# Patient Record
Sex: Male | Born: 1945 | Race: White | Hispanic: No | Marital: Married | Smoking: Never smoker
Health system: Southern US, Community
[De-identification: ages and names within clinical notes are randomized; demographics above are authoritative.]

## PROBLEM LIST (undated history)

## (undated) DIAGNOSIS — K37 Unspecified appendicitis: Secondary | ICD-10-CM

## (undated) DIAGNOSIS — I10 Essential (primary) hypertension: Secondary | ICD-10-CM

## (undated) DIAGNOSIS — E785 Hyperlipidemia, unspecified: Secondary | ICD-10-CM

## (undated) DIAGNOSIS — M1990 Unspecified osteoarthritis, unspecified site: Secondary | ICD-10-CM

## (undated) HISTORY — DX: Hyperlipidemia, unspecified: E78.5

## (undated) HISTORY — PX: APPENDECTOMY: SHX54

## (undated) HISTORY — DX: Unspecified appendicitis: K37

## (undated) HISTORY — DX: Unspecified osteoarthritis, unspecified site: M19.90

## (undated) HISTORY — PX: EYE MUSCLE SURGERY: SHX370

## (undated) HISTORY — DX: Essential (primary) hypertension: I10

## (undated) HISTORY — PX: COLONOSCOPY: SHX174

---

## 1999-10-31 ENCOUNTER — Encounter: Payer: Self-pay | Admitting: Internal Medicine

## 1999-10-31 ENCOUNTER — Ambulatory Visit (HOSPITAL_COMMUNITY): Admission: RE | Admit: 1999-10-31 | Discharge: 1999-10-31 | Payer: Self-pay | Admitting: Internal Medicine

## 2000-12-01 ENCOUNTER — Encounter: Payer: Self-pay | Admitting: Internal Medicine

## 2001-12-04 ENCOUNTER — Encounter: Payer: Self-pay | Admitting: *Deleted

## 2001-12-04 ENCOUNTER — Ambulatory Visit (HOSPITAL_COMMUNITY): Admission: RE | Admit: 2001-12-04 | Discharge: 2001-12-04 | Payer: Self-pay | Admitting: *Deleted

## 2002-05-27 ENCOUNTER — Ambulatory Visit (HOSPITAL_BASED_OUTPATIENT_CLINIC_OR_DEPARTMENT_OTHER): Admission: RE | Admit: 2002-05-27 | Discharge: 2002-05-27 | Payer: Self-pay | Admitting: Ophthalmology

## 2002-07-14 LAB — HM COLONOSCOPY

## 2002-11-29 ENCOUNTER — Encounter: Payer: Self-pay | Admitting: Internal Medicine

## 2004-05-08 ENCOUNTER — Ambulatory Visit: Payer: Self-pay | Admitting: Internal Medicine

## 2004-05-15 ENCOUNTER — Ambulatory Visit: Payer: Self-pay | Admitting: Internal Medicine

## 2004-11-12 ENCOUNTER — Ambulatory Visit: Payer: Self-pay | Admitting: Internal Medicine

## 2004-11-19 ENCOUNTER — Ambulatory Visit: Payer: Self-pay | Admitting: Internal Medicine

## 2005-06-04 ENCOUNTER — Ambulatory Visit: Payer: Self-pay | Admitting: Internal Medicine

## 2005-06-10 ENCOUNTER — Ambulatory Visit: Payer: Self-pay | Admitting: Internal Medicine

## 2005-12-10 ENCOUNTER — Ambulatory Visit: Payer: Self-pay | Admitting: Internal Medicine

## 2006-05-27 ENCOUNTER — Ambulatory Visit: Payer: Self-pay | Admitting: Internal Medicine

## 2006-05-27 LAB — CONVERTED CEMR LAB
ALT: 30 units/L (ref 0–40)
AST: 32 units/L (ref 0–37)
Albumin: 4.1 g/dL (ref 3.5–5.2)
Alkaline Phosphatase: 60 units/L (ref 39–117)
BUN: 12 mg/dL (ref 6–23)
Basophils Absolute: 0 10*3/uL (ref 0.0–0.1)
Basophils Relative: 0.3 % (ref 0.0–1.0)
CO2: 31 meq/L (ref 19–32)
Calcium: 9.5 mg/dL (ref 8.4–10.5)
Chloride: 103 meq/L (ref 96–112)
Chol/HDL Ratio, serum: 6.8
Cholesterol: 201 mg/dL (ref 0–200)
Creatinine, Ser: 1.2 mg/dL (ref 0.4–1.5)
Eosinophil percent: 2.1 % (ref 0.0–5.0)
GFR calc non Af Amer: 66 mL/min
Glomerular Filtration Rate, Af Am: 80 mL/min/{1.73_m2}
Glucose, Bld: 101 mg/dL — ABNORMAL HIGH (ref 70–99)
HCT: 44.8 % (ref 39.0–52.0)
HDL: 29.7 mg/dL — ABNORMAL LOW (ref >39.0)
Hemoglobin: 15.1 g/dL (ref 13.0–17.0)
LDL DIRECT: 116.9 mg/dL
Lymphocytes Relative: 25.1 % (ref 12.0–46.0)
MCHC: 33.8 g/dL (ref 30.0–36.0)
MCV: 91.3 fL (ref 78.0–100.0)
Monocytes Absolute: 0.6 10*3/uL (ref 0.2–0.7)
Monocytes Relative: 7.4 % (ref 3.0–11.0)
Neutro Abs: 5.7 10*3/uL (ref 1.4–7.7)
Neutrophils Relative %: 65.1 % (ref 43.0–77.0)
PSA: 0.49 ng/mL (ref 0.10–4.00)
Platelets: 243 10*3/uL (ref 150–400)
Potassium: 3.6 meq/L (ref 3.5–5.1)
RBC: 4.9 M/uL (ref 4.22–5.81)
RDW: 12.2 % (ref 11.5–14.6)
Sodium: 141 meq/L (ref 135–145)
TSH: 1.93 microintl units/mL (ref 0.35–5.50)
Total Bilirubin: 1.1 mg/dL (ref 0.3–1.2)
Total Protein: 7.1 g/dL (ref 6.0–8.3)
Triglyceride fasting, serum: 264 mg/dL (ref 0–149)
VLDL: 53 mg/dL — ABNORMAL HIGH (ref 0–40)
WBC: 8.7 10*3/uL (ref 4.5–10.5)

## 2006-06-03 ENCOUNTER — Ambulatory Visit: Payer: Self-pay | Admitting: Internal Medicine

## 2006-12-02 ENCOUNTER — Ambulatory Visit: Payer: Self-pay | Admitting: Internal Medicine

## 2006-12-02 LAB — CONVERTED CEMR LAB
ALT: 27 units/L (ref 0–40)
AST: 26 units/L (ref 0–37)
Albumin: 4.2 g/dL (ref 3.5–5.2)
Alkaline Phosphatase: 69 units/L (ref 39–117)
BUN: 16 mg/dL (ref 6–23)
Bilirubin, Direct: 0.1 mg/dL (ref 0.0–0.3)
CO2: 33 meq/L — ABNORMAL HIGH (ref 19–32)
Calcium: 9.5 mg/dL (ref 8.4–10.5)
Chloride: 105 meq/L (ref 96–112)
Cholesterol: 191 mg/dL (ref 0–200)
Creatinine, Ser: 1.2 mg/dL (ref 0.4–1.5)
Direct LDL: 101.3 mg/dL
GFR calc Af Amer: 80 mL/min
GFR calc non Af Amer: 66 mL/min
Glucose, Bld: 106 mg/dL — ABNORMAL HIGH (ref 70–99)
HDL: 29.6 mg/dL — ABNORMAL LOW (ref >39.0)
Potassium: 3.7 meq/L (ref 3.5–5.1)
Sodium: 142 meq/L (ref 135–145)
Total Bilirubin: 0.6 mg/dL (ref 0.3–1.2)
Total CHOL/HDL Ratio: 6.5
Total Protein: 6.9 g/dL (ref 6.0–8.3)
Triglycerides: 304 mg/dL (ref 0–149)
VLDL: 61 mg/dL — ABNORMAL HIGH (ref 0–40)

## 2007-03-25 DIAGNOSIS — I10 Essential (primary) hypertension: Secondary | ICD-10-CM | POA: Insufficient documentation

## 2007-03-25 DIAGNOSIS — E785 Hyperlipidemia, unspecified: Secondary | ICD-10-CM | POA: Insufficient documentation

## 2007-06-07 ENCOUNTER — Ambulatory Visit: Payer: Self-pay | Admitting: Internal Medicine

## 2007-06-07 LAB — CONVERTED CEMR LAB
ALT: 29 units/L (ref 0–53)
AST: 29 units/L (ref 0–37)
Albumin: 4.1 g/dL (ref 3.5–5.2)
Alkaline Phosphatase: 59 units/L (ref 39–117)
BUN: 16 mg/dL (ref 6–23)
Basophils Absolute: 0 10*3/uL (ref 0.0–0.1)
Basophils Relative: 0.1 % (ref 0.0–1.0)
Bilirubin, Direct: 0.1 mg/dL (ref 0.0–0.3)
CO2: 29 meq/L (ref 19–32)
Calcium: 9.5 mg/dL (ref 8.4–10.5)
Chloride: 100 meq/L (ref 96–112)
Cholesterol: 207 mg/dL (ref 0–200)
Creatinine, Ser: 1.2 mg/dL (ref 0.4–1.5)
Direct LDL: 132 mg/dL
Eosinophils Absolute: 0.2 10*3/uL (ref 0.0–0.6)
Eosinophils Relative: 1.8 % (ref 0.0–5.0)
GFR calc Af Amer: 79 mL/min
GFR calc non Af Amer: 66 mL/min
Glucose, Bld: 99 mg/dL (ref 70–99)
Glucose, Urine, Semiquant: NEGATIVE
HCT: 43 % (ref 39.0–52.0)
HDL: 30.9 mg/dL — ABNORMAL LOW (ref >39.0)
Hemoglobin: 15.1 g/dL (ref 13.0–17.0)
Ketones, urine, test strip: NEGATIVE
Lymphocytes Relative: 22.9 % (ref 12.0–46.0)
MCHC: 35.2 g/dL (ref 30.0–36.0)
MCV: 90.9 fL (ref 78.0–100.0)
Monocytes Absolute: 0.6 10*3/uL (ref 0.2–0.7)
Monocytes Relative: 6.7 % (ref 3.0–11.0)
Neutro Abs: 6.1 10*3/uL (ref 1.4–7.7)
Neutrophils Relative %: 68.5 % (ref 43.0–77.0)
Nitrite: NEGATIVE
PSA: 0.7 ng/mL (ref 0.10–4.00)
Platelets: 230 10*3/uL (ref 150–400)
Potassium: 3.9 meq/L (ref 3.5–5.1)
RBC: 4.73 M/uL (ref 4.22–5.81)
RDW: 13.1 % (ref 11.5–14.6)
Sodium: 140 meq/L (ref 135–145)
Specific Gravity, Urine: >=1.03
TSH: 2.01 microintl units/mL (ref 0.35–5.50)
Total Bilirubin: 0.7 mg/dL (ref 0.3–1.2)
Total CHOL/HDL Ratio: 6.7
Total Protein: 6.7 g/dL (ref 6.0–8.3)
Triglycerides: 248 mg/dL (ref 0–149)
Urobilinogen, UA: 0.2
VLDL: 50 mg/dL — ABNORMAL HIGH (ref 0–40)
WBC Urine, dipstick: NEGATIVE
WBC: 9 10*3/uL (ref 4.5–10.5)
pH: 5

## 2007-06-14 ENCOUNTER — Ambulatory Visit: Payer: Self-pay | Admitting: Internal Medicine

## 2007-08-19 ENCOUNTER — Ambulatory Visit: Payer: Self-pay | Admitting: Internal Medicine

## 2007-08-19 LAB — CONVERTED CEMR LAB
ALT: 34 units/L (ref 0–53)
AST: 30 units/L (ref 0–37)
Albumin: 4.1 g/dL (ref 3.5–5.2)
Alkaline Phosphatase: 68 units/L (ref 39–117)
Bilirubin, Direct: 0.2 mg/dL (ref 0.0–0.3)
Cholesterol: 172 mg/dL (ref 0–200)
Direct LDL: 80.6 mg/dL
HDL: 26.1 mg/dL — ABNORMAL LOW (ref >39.0)
Total Bilirubin: 1.5 mg/dL — ABNORMAL HIGH (ref 0.3–1.2)
Total CHOL/HDL Ratio: 6.6
Total Protein: 6.7 g/dL (ref 6.0–8.3)
Triglycerides: 340 mg/dL (ref 0–149)
VLDL: 68 mg/dL — ABNORMAL HIGH (ref 0–40)

## 2007-08-27 ENCOUNTER — Ambulatory Visit: Payer: Self-pay | Admitting: Internal Medicine

## 2007-11-09 ENCOUNTER — Telehealth: Payer: Self-pay | Admitting: Internal Medicine

## 2007-11-17 ENCOUNTER — Telehealth: Payer: Self-pay | Admitting: Internal Medicine

## 2007-12-31 ENCOUNTER — Ambulatory Visit: Payer: Self-pay | Admitting: Internal Medicine

## 2007-12-31 DIAGNOSIS — M109 Gout, unspecified: Secondary | ICD-10-CM | POA: Insufficient documentation

## 2008-02-28 ENCOUNTER — Ambulatory Visit: Payer: Self-pay | Admitting: Internal Medicine

## 2008-02-28 LAB — CONVERTED CEMR LAB
ALT: 26 units/L (ref 0–53)
AST: 24 units/L (ref 0–37)
Albumin: 4.1 g/dL (ref 3.5–5.2)
Alkaline Phosphatase: 90 units/L (ref 39–117)
Bilirubin, Direct: 0.1 mg/dL (ref 0.0–0.3)
Cholesterol: 155 mg/dL (ref 0–200)
Direct LDL: 70.6 mg/dL
HDL: 23.1 mg/dL — ABNORMAL LOW (ref >39.0)
Total Bilirubin: 0.7 mg/dL (ref 0.3–1.2)
Total CHOL/HDL Ratio: 6.7
Total Protein: 6.6 g/dL (ref 6.0–8.3)
Triglycerides: 388 mg/dL (ref 0–149)
VLDL: 78 mg/dL — ABNORMAL HIGH (ref 0–40)

## 2008-05-19 ENCOUNTER — Ambulatory Visit: Payer: Self-pay | Admitting: Internal Medicine

## 2008-11-10 ENCOUNTER — Ambulatory Visit: Payer: Self-pay | Admitting: Internal Medicine

## 2008-11-10 LAB — CONVERTED CEMR LAB
ALT: 35 units/L (ref 0–53)
AST: 31 units/L (ref 0–37)
Albumin: 4.2 g/dL (ref 3.5–5.2)
Alkaline Phosphatase: 93 units/L (ref 39–117)
BUN: 17 mg/dL (ref 6–23)
Basophils Absolute: 0 10*3/uL (ref 0.0–0.1)
Basophils Relative: 0.1 % (ref 0.0–3.0)
Bilirubin Urine: NEGATIVE
Bilirubin, Direct: 0.2 mg/dL (ref 0.0–0.3)
Blood in Urine, dipstick: NEGATIVE
CO2: 26 meq/L (ref 19–32)
Calcium: 8.9 mg/dL (ref 8.4–10.5)
Chloride: 107 meq/L (ref 96–112)
Cholesterol: 143 mg/dL (ref 0–200)
Creatinine, Ser: 1.1 mg/dL (ref 0.4–1.5)
Eosinophils Absolute: 0.1 10*3/uL (ref 0.0–0.7)
Eosinophils Relative: 1.4 % (ref 0.0–5.0)
GFR calc non Af Amer: 72.23 mL/min (ref >60)
Glucose, Bld: 88 mg/dL (ref 70–99)
Glucose, Urine, Semiquant: NEGATIVE
HCT: 44 % (ref 39.0–52.0)
HDL: 25.3 mg/dL — ABNORMAL LOW (ref >39.00)
Hemoglobin: 15.4 g/dL (ref 13.0–17.0)
Ketones, urine, test strip: NEGATIVE
LDL Cholesterol: 80 mg/dL (ref 0–99)
Lymphocytes Relative: 15.1 % (ref 12.0–46.0)
Lymphs Abs: 1.3 10*3/uL (ref 0.7–4.0)
MCHC: 35.1 g/dL (ref 30.0–36.0)
MCV: 89.1 fL (ref 78.0–100.0)
Monocytes Absolute: 0.5 10*3/uL (ref 0.1–1.0)
Monocytes Relative: 6.2 % (ref 3.0–12.0)
Neutro Abs: 6.7 10*3/uL (ref 1.4–7.7)
Neutrophils Relative %: 77.2 % — ABNORMAL HIGH (ref 43.0–77.0)
Nitrite: NEGATIVE
PSA: 0.44 ng/mL (ref 0.10–4.00)
Platelets: 218 10*3/uL (ref 150.0–400.0)
Potassium: 4.5 meq/L (ref 3.5–5.1)
Protein, U semiquant: NEGATIVE
RBC: 4.93 M/uL (ref 4.22–5.81)
RDW: 12.6 % (ref 11.5–14.6)
Sodium: 143 meq/L (ref 135–145)
Specific Gravity, Urine: 1.025
TSH: 1.59 microintl units/mL (ref 0.35–5.50)
Total Bilirubin: 1 mg/dL (ref 0.3–1.2)
Total CHOL/HDL Ratio: 6
Total Protein: 6.6 g/dL (ref 6.0–8.3)
Triglycerides: 188 mg/dL — ABNORMAL HIGH (ref 0.0–149.0)
Urobilinogen, UA: 1
VLDL: 37.6 mg/dL (ref 0.0–40.0)
WBC Urine, dipstick: NEGATIVE
WBC: 8.6 10*3/uL (ref 4.5–10.5)
pH: 5

## 2008-11-20 ENCOUNTER — Ambulatory Visit: Payer: Self-pay | Admitting: Internal Medicine

## 2009-04-17 ENCOUNTER — Ambulatory Visit: Payer: Self-pay | Admitting: Internal Medicine

## 2009-04-17 LAB — CONVERTED CEMR LAB
Albumin: 4 g/dL (ref 3.5–5.2)
Cholesterol: 143 mg/dL (ref 0–200)
Direct LDL: 79.1 mg/dL
GFR calc non Af Amer: 59.48 mL/min (ref >60)
HDL: 29 mg/dL — ABNORMAL LOW (ref >39.00)
Potassium: 3.6 meq/L (ref 3.5–5.1)
Sodium: 140 meq/L (ref 135–145)
Total Protein: 7.1 g/dL (ref 6.0–8.3)
VLDL: 43.4 mg/dL — ABNORMAL HIGH (ref 0.0–40.0)

## 2009-04-24 ENCOUNTER — Ambulatory Visit: Payer: Self-pay | Admitting: Internal Medicine

## 2009-08-10 ENCOUNTER — Ambulatory Visit: Payer: Self-pay | Admitting: Internal Medicine

## 2009-11-16 ENCOUNTER — Ambulatory Visit: Payer: Self-pay | Admitting: Internal Medicine

## 2009-11-16 LAB — CONVERTED CEMR LAB
ALT: 32 units/L (ref 0–53)
Albumin: 4.2 g/dL (ref 3.5–5.2)
Bilirubin Urine: NEGATIVE
Calcium: 9 mg/dL (ref 8.4–10.5)
Chloride: 103 meq/L (ref 96–112)
Cholesterol: 151 mg/dL (ref 0–200)
Eosinophils Relative: 1.5 % (ref 0.0–5.0)
Glucose, Bld: 91 mg/dL (ref 70–99)
Glucose, Urine, Semiquant: NEGATIVE
HCT: 44.6 % (ref 39.0–52.0)
LDL Cholesterol: 80 mg/dL (ref 0–99)
Lymphs Abs: 1.7 10*3/uL (ref 0.7–4.0)
MCV: 89.5 fL (ref 78.0–100.0)
Monocytes Absolute: 0.6 10*3/uL (ref 0.1–1.0)
PSA: 0.49 ng/mL (ref 0.10–4.00)
Platelets: 240 10*3/uL (ref 150.0–400.0)
Potassium: 3.8 meq/L (ref 3.5–5.1)
TSH: 1.51 microintl units/mL (ref 0.35–5.50)
Total Bilirubin: 0.7 mg/dL (ref 0.3–1.2)
Total Protein: 6.8 g/dL (ref 6.0–8.3)
WBC: 8.1 10*3/uL (ref 4.5–10.5)
pH: 6

## 2009-11-23 ENCOUNTER — Ambulatory Visit: Payer: Self-pay | Admitting: Internal Medicine

## 2010-03-13 ENCOUNTER — Encounter: Payer: Self-pay | Admitting: Internal Medicine

## 2010-05-22 ENCOUNTER — Ambulatory Visit: Payer: Self-pay | Admitting: Internal Medicine

## 2010-05-22 LAB — CONVERTED CEMR LAB
ALT: 24 units/L (ref 0–53)
AST: 22 units/L (ref 0–37)
Alkaline Phosphatase: 81 units/L (ref 39–117)
BUN: 17 mg/dL (ref 6–23)
Bilirubin, Direct: 0.1 mg/dL (ref 0.0–0.3)
Calcium: 9.1 mg/dL (ref 8.4–10.5)
GFR calc non Af Amer: 72.63 mL/min (ref >60)
Glucose, Bld: 94 mg/dL (ref 70–99)
Total Bilirubin: 0.9 mg/dL (ref 0.3–1.2)

## 2010-06-03 ENCOUNTER — Ambulatory Visit: Payer: Self-pay | Admitting: Internal Medicine

## 2010-08-01 ENCOUNTER — Telehealth: Payer: Self-pay | Admitting: Internal Medicine

## 2010-08-13 NOTE — Assessment & Plan Note (Signed)
Summary: cpx/njr   Vital Signs:  Patient profile:   65 year old male Height:      68.5 inches Weight:      232 pounds BMI:     34.89 Pulse rate:   60 / minute Pulse rhythm:   regular Resp:     12 per minute BP sitting:   130 / 76  (left arm) Cuff size:   regular  Vitals Entered By: Gladis Riffle, RN (Nov 23, 2009 8:08 AM)  Nutrition Counseling: Patient's BMI is greater than 25 and therefore counseled on weight management options. CC: cpx, labs done Is Patient Diabetic? No Comments has not done BP recently   CC:  cpx and labs done.  History of Present Illness: CPX  Preventive Screening-Counseling & Management  Alcohol-Tobacco     Alcohol drinks/day: <1     Smoking Status: never  Current Problems (verified): 1)  Gout  (ICD-274.9) 2)  Routine General Medical Exam@health  Care [facility]  (ICD-V70.0) 3)  Hypertension  (ICD-401.9) 4)  Hyperlipidemia  (ICD-272.4)  Current Medications (verified): 1)  Monopril 40 Mg Tabs (Fosinopril Sodium) .... Take 1 Tablet By Mouth Once A Day 2)  Norvasc 5 Mg Tabs (Amlodipine Besylate) .... Take 1 Tablet By Mouth Once A Day 3)  Aspirin 81 Mg  Tbec (Aspirin) .... Once Daily 4)  Simvastatin 10 Mg Tabs (Simvastatin) .Marland Kitchen.. 1 By Mouth Once Daily 5)  Omega-3 Cf 1000 Mg Caps (Omega-3 Fatty Acids) .... Tid 6)  Atenolol 50 Mg Tabs (Atenolol) .... One Daily 7)  Colcrys 0.6 Mg Tabs (Colchicine) .... Take 1 Tablet By Mouth Three Times A Day As Needed Gout Hold For Diarrhea  Allergies (verified): No Known Drug Allergies  Past History:  Past Medical History: Last updated: 03/25/2007 Hyperlipidemia Hypertension  Past Surgical History: Last updated: 03/25/2007 Appendectomy  age20  Family History: Last updated: 07-09-2007 father died tape worm in 35s Mother died related to head injury 76s  parkinson's dzs.   Social History: Last updated: 07/09/2007 Occupation: Married Regular exercise-no Never Smoked Alcohol use-yes  Risk Factors: Alcohol  Use: <1 (11/23/2009) Exercise: no (July 09, 2007)  Risk Factors: Smoking Status: never (11/23/2009)  Review of Systems       All other systems reviewed and were negative    Impression & Recommendations:  Problem # 1:  ROUTINE GENERAL MEDICAL EXAM@HEALTH  CARE [facility]AC[facility] (ICD-V70.0) health maint UTD advised exercise and diet.   Problem # 2:  HYPERTENSION (ICD-401.9) reasonable control His updated medication list for this problem includes:    Monopril 40 Mg Tabs (Fosinopril sodium) .Marland Kitchen... Take 1 tablet by mouth once a day    Norvasc 5 Mg Tabs (Amlodipine besylate) .Marland Kitchen... Take 1 tablet by mouth once a day    Atenolol 50 Mg Tabs (Atenolol) ..... One daily  BP today: 130/76 Prior BP: 110/66 (08/10/2009)  Labs Reviewed: K+: 3.8 (11/16/2009) Creat: : 1.0 (11/16/2009)   Chol: 151 (11/16/2009)   HDL: 36.70 (11/16/2009)   LDL: 80 (11/16/2009)   TG: 173.0 (11/16/2009)  Problem # 3:  HYPERLIPIDEMIA (ICD-272.4) good control His updated medication list for this problem includes:    Simvastatin 10 Mg Tabs (Simvastatin) .Marland Kitchen... 1 by mouth once daily  Labs Reviewed: SGOT: 30 (11/16/2009)   SGPT: 32 (11/16/2009)   HDL:36.70 (11/16/2009), 29.00 (04/17/2009)  LDL:80 (11/16/2009), 80 (11/10/2008)  Chol:151 (11/16/2009), 143 (04/17/2009)  Trig:173.0 (11/16/2009), 217.0 (04/17/2009)  Problem # 4:  GOUT (ICD-274.9)  discussed use of NSAIDs and colchicine  His updated medication list for this problem includes:  Colcrys 0.6 Mg Tabs (Colchicine) .Marland Kitchen... Take 1 tablet by mouth three times a day as needed gout hold for diarrhea  Complete Medication List: 1)  Monopril 40 Mg Tabs (Fosinopril sodium) .... Take 1 tablet by mouth once a day 2)  Norvasc 5 Mg Tabs (Amlodipine besylate) .... Take 1 tablet by mouth once a day 3)  Aspirin 81 Mg Tbec (Aspirin) .... Once daily 4)  Simvastatin 10 Mg Tabs (Simvastatin) .Marland Kitchen.. 1 by mouth once daily 5)  Omega-3 Cf 1000 Mg Caps (Omega-3 fatty acids) .... Tid 6)  Atenolol 50  Mg Tabs (Atenolol) .... One daily 7)  Colcrys 0.6 Mg Tabs (Colchicine) .... Take 1 tablet by mouth three times a day as needed gout hold for diarrhea  Patient Instructions: 1)  Please schedule a follow-up appointment in 6 months. 2)  lipids 272.4 3)  liver 995.2 bmet, 995.2 Prescriptions: COLCRYS 0.6 MG TABS (COLCHICINE) Take 1 tablet by mouth three times a day as needed gout hold for diarrhea  #20 x 3   Entered and Authorized by:   Birdie Sons MD   Signed by:   Birdie Sons MD on 11/23/2009   Method used:   [facility] to        [facility] [address] [phone]* (retail)       [address]       [place], [place]  [address]       Ph: [phone] or [phone]       Fax: [phone]   RxID:   [id number]  Physical Exam General Appearance: well developed, well nourished, no acute distress Eyes: conjunctiva and lids normal, PERRLA, EOMI,  Ears, Nose, Mouth, Throat: TM clear, nares clear, oral exam WNL Neck: supple, no lymphadenopathy, no thyromegaly, no JVD Respiratory: clear to auscultation and percussion, respiratory effort normal Cardiovascular: regular rate and rhythm, S1-S2, no murmur, rub or gallop, no bruits, peripheral pulses normal and symmetric, no cyanosis, clubbing, edema or varicosities Chest: no scars, masses, tenderness; no asymmetry, skin changes, nipple discharge, no gynecomastia   Gastrointestinal: soft, non-tender; no hepatosplenomegaly, masses; active bowel sounds all quadrants, ; no masses, tenderness, hemorrhoids  Genitourinary: no hernia, or prostate enlargement Lymphatic: no cervical, axillary or inguinal adenopathy Musculoskeletal: gait normal, muscle tone and strength WNL, no joint swelling, effusions, discoloration, crepitus  Skin: clear, good turgor, color WNL, no rashes, lesions, or ulcerations Neurologic: normal mental status, normal reflexes, normal strength, sensation, and motion Psychiatric: alert; oriented to person, place  and time Other Exam:

## 2010-08-13 NOTE — Letter (Signed)
Summary: [facility]  [facility]   Imported By: Sherian Rein 03/20/2010 11:37:33  _____________________________________________________________________  External Attachment:    Type:   Image     Comment:   External Document

## 2010-08-13 NOTE — Assessment & Plan Note (Signed)
Summary: 6 month rov/njr   Vital Signs:  Patient profile:   65 year old male Weight:      235 pounds Temp:     98.8 degrees F oral Pulse rate:   58 / minute Pulse rhythm:   regular BP sitting:   132 / 80  (left arm) Cuff size:   large  Vitals Entered By: Alfred Levins, CMA (June 03, 2010 8:15 AM) CC: f/u, discuss labs   CC:  f/u and discuss labs.  Current Medications (verified): 1)  Monopril 40 Mg Tabs (Fosinopril Sodium) .... Take 1 Tablet By Mouth Once A Day 2)  Norvasc 5 Mg Tabs (Amlodipine Besylate) .... Take 1 Tablet By Mouth Once A Day 3)  Aspirin 81 Mg  Tbec (Aspirin) .... Once Daily 4)  Simvastatin 10 Mg Tabs (Simvastatin) .Marland Kitchen.. 1 By Mouth Once Daily 5)  Omega-3 Cf 1000 Mg Caps (Omega-3 Fatty Acids) .... Tid 6)  Atenolol 50 Mg Tabs (Atenolol) .... One Daily 7)  Colcrys 0.6 Mg Tabs (Colchicine) .... Take 1 Tablet By Mouth Three Times A Day As Needed Gout Hold For Diarrhea  Allergies (verified): No Known Drug Allergies  Physical Exam  General:  overweight male in no acute distress HEENT exam atraumatic, normocephalic. Chest clear to auscultation cardiac exam S1-S2 are regular.   Impression & Recommendations:  Problem # 1:  GOUT (ICD-274.9) no recurrence His updated medication list for this problem includes:    Colcrys 0.6 Mg Tabs (Colchicine) .Marland Kitchen... Take 1 tablet by mouth three times a day as needed gout hold for diarrhea  Problem # 2:  HYPERTENSION (ICD-401.9)  well-controlled. Continue current medications. His updated medication list for this problem includes:    Monopril 40 Mg Tabs (Fosinopril sodium) .Marland Kitchen... Take 1 tablet by mouth once a day    Norvasc 5 Mg Tabs (Amlodipine besylate) .Marland Kitchen... Take 1 tablet by mouth once a day    Atenolol 50 Mg Tabs (Atenolol) ..... One daily  BP today: 132/80 Prior BP: 130/76 (11/23/2009)  Labs Reviewed: K+: 4.0 (05/22/2010) Creat: : 1.1 (05/22/2010)   Chol: 161 (05/22/2010)   HDL: 34.80 (05/22/2010)   LDL: 92  (05/22/2010)   TG: 172.0 (05/22/2010)  Problem # 3:  HYPERLIPIDEMIA (ICD-272.4) reasonable control. Continue current medications. Note concurrent use of amlodipine. Low-dose simvastatin. His updated medication list for this problem includes:    Simvastatin 10 Mg Tabs (Simvastatin) .Marland Kitchen... 1 by mouth once daily  Labs Reviewed: SGOT: 22 (05/22/2010)   SGPT: 24 (05/22/2010)   HDL:34.80 (05/22/2010), 36.70 (11/16/2009)  LDL:92 (05/22/2010), 80 (11/16/2009)  Chol:161 (05/22/2010), 151 (11/16/2009)  Trig:172.0 (05/22/2010), 173.0 (11/16/2009)  Complete Medication List: 1)  Monopril 40 Mg Tabs (Fosinopril sodium) .... Take 1 tablet by mouth once a day 2)  Norvasc 5 Mg Tabs (Amlodipine besylate) .... Take 1 tablet by mouth once a day 3)  Aspirin 81 Mg Tbec (Aspirin) .... Once daily 4)  Simvastatin 10 Mg Tabs (Simvastatin) .Marland Kitchen.. 1 by mouth once daily 5)  Omega-3 Cf 1000 Mg Caps (Omega-3 fatty acids) .... Tid 6)  Atenolol 50 Mg Tabs (Atenolol) .... One daily 7)  Colcrys 0.6 Mg Tabs (Colchicine) .... Take 1 tablet by mouth three times a day as needed gout hold for diarrhea  Patient Instructions: 1)  Please schedule a follow-up appointment in 6 months. CPX   Orders Added: 1)  Est. Patient Level IV [16109]

## 2010-08-13 NOTE — Assessment & Plan Note (Signed)
Summary: GOUT? // RS  In the and there is a the anesthesia and was as if he is a  Vital Signs:  Patient profile:   65 year old male Weight:      240 pounds Temp:     99.3 degrees F oral BP sitting:   110 / 66  (left arm) Cuff size:   regular  Vitals Entered By: Raechel Ache, RN (August 10, 2009 10:10 AM) CC: C/o gout R foot   CC:  C/o gout R foot.  History of Present Illness: 65 year old patient with a history of gout, and multi-drug-resistant hypertension.  Blood pressure regimen includes diuretic therapy.  He presents with a two-day history of pain involving his right lateral foot;  over the past two years, he has had 4 episodes of acute gouty arthritis and he is asking about a preventive strategy.  Blood pressure today is in the low normal range.  Allergies: No Known Drug Allergies  Physical Exam  General:  overweight-appearing.  blood pressure low normaloverweight-appearing.   Msk:  slight tenderness and erythema involving the right lateral foot about the MTP joint   Impression & Recommendations:  Problem # 1:  GOUT (ICD-274.9) will discontinue  the chlorthalidone and consider checking a uric acid level at the next return office visit  Problem # 2:  HYPERTENSION (ICD-401.9)  The following medications were removed from the medication list:    Atenolol-chlorthalidone 50-25 Mg Tabs (Atenolol-chlorthalidone) ..... One by mouth q day His updated medication list for this problem includes:    Monopril 40 Mg Tabs (Fosinopril sodium) .Marland Kitchen... Take 1 tablet by mouth once a day    Norvasc 5 Mg Tabs (Amlodipine besylate) .Marland Kitchen... Take 1 tablet by mouth once a day    Atenolol 50 Mg Tabs (Atenolol) ..... One daily    The following medications were removed from the medication list:    Atenolol-chlorthalidone 50-25 Mg Tabs (Atenolol-chlorthalidone) ..... One by mouth q day His updated medication list for this problem includes:    Monopril 40 Mg Tabs (Fosinopril sodium) .Marland Kitchen... Take 1  tablet by mouth once a day    Norvasc 5 Mg Tabs (Amlodipine besylate) .Marland Kitchen... Take 1 tablet by mouth once a day    Atenolol 50 Mg Tabs (Atenolol) ..... One daily  Complete Medication List: 1)  Monopril 40 Mg Tabs (Fosinopril sodium) .... Take 1 tablet by mouth once a day 2)  Norvasc 5 Mg Tabs (Amlodipine besylate) .... Take 1 tablet by mouth once a day 3)  Aspirin 81 Mg Tbec (Aspirin) .... Once daily 4)  Simvastatin 10 Mg Tabs (Simvastatin) .Marland Kitchen.. 1 by mouth once daily 5)  Omega-3 Cf 1000 Mg Caps (Omega-3 fatty acids) .... Tid 6)  Atenolol 50 Mg Tabs (Atenolol) .... One daily  Other Orders: Prescription Created Electronically [id number])  Patient Instructions: 1)  Please schedule a follow-up appointment in 3 months. 2)  Limit your Sodium (Salt). 3)  It is important that you exercise regularly at least 20 minutes 5 times a week. If you develop chest pain, have severe difficulty breathing, or feel very tired , stop exercising immediately and seek medical attention. Prescriptions: ATENOLOL 50 MG TABS (ATENOLOL) one daily  #90 x 6   Entered and Authorized by:   Gordy Savers  MD   Signed by:   Gordy Savers  MD on 08/10/2009   Method used:   Print then Give to Patient   RxID:   [id number] SIMVASTATIN 10 MG TABS (SIMVASTATIN) 1  by mouth once daily  #90 x 3   Entered and Authorized by:   Gordy Savers  MD   Signed by:   Gordy Savers  MD on 08/10/2009   Method used:   Print then Give to Patient   RxID:   [id number] NORVASC 5 MG TABS (AMLODIPINE BESYLATE) Take 1 tablet by mouth once a day  #90 x 3   Entered and Authorized by:   Gordy Savers  MD   Signed by:   Gordy Savers  MD on 08/10/2009   Method used:   Print then Give to Patient   RxID:   [id number] MONOPRIL 40 MG TABS (FOSINOPRIL SODIUM) Take 1 tablet by mouth once a day  #90 x 3   Entered and Authorized by:   Gordy Savers  MD   Signed by:   Gordy Savers  MD on  08/10/2009   Method used:   Print then Give to Patient   RxID:   [id number] ATENOLOL 50 MG TABS (ATENOLOL) one daily  #90 x 6   Entered and Authorized by:   Gordy Savers  MD   Signed by:   Gordy Savers  MD on 08/10/2009   Method used:   [facility]ronically to        [facility] [address] [phone]* (retail)       [address]       [place], [place]  [address]       Ph: [phone] or [phone]       Fax: [phone]   RxID:   [id number] SIMVASTATIN 10 MG TABS (SIMVASTATIN) 1 by mouth once daily  #90 x 3   Entered and Authorized by:   Gordy Savers  MD   Signed by:   Gordy Savers  MD on 08/10/2009   Method used:   Electronically to        [facility] [address] [phone]* (retail)       [address]       [place], [place]  [address]       Ph: [phone] or [phone]       Fax: [phone]   RxID:   [id number] NORVASC 5 MG TABS (AMLODIPINE BESYLATE) Take 1 tablet by mouth once a day  #90 x 3   Entered and Authorized by:   Gordy Savers  MD   Signed by:   Gordy Savers  MD on 08/10/2009   Method used:   Electronically to        [facility] [address] [phone]* (retail)       [address]       [place], [place]  [address]       Ph: [phone] or [phone]       Fax: [phone]   RxID:   [id number] MONOPRIL 40 MG TABS (FOSINOPRIL SODIUM) Take 1 tablet by mouth once a day  #90 x 3   Entered and Authorized by:   Gordy Savers  MD   Signed by:   Gordy Savers  MD on 08/10/2009   Method used:   Electronically to        [facility] [address] [phone]* (retail)       [address]       [place]  [place], [place]  [address]       Ph: [phone] or [phone]       Fax: [phone]   RxID:   [id number]

## 2010-08-15 NOTE — Progress Notes (Signed)
Summary: Rx Refill  Phone Note Refill Request Call back at [phone] Message from:  Patient on August 01, 2010 4:46 PM  Refills Requested: Medication #1:  MONOPRIL 40 MG TABS Take 1 tablet by mouth once a day  Medication #2:  NORVASC 5 MG TABS Take 1 tablet by mouth once a day  Medication #3:  SIMVASTATIN 10 MG TABS 1 by mouth once daily  Medication #4:  ATENOLOL 50 MG TABS one daily  Method Requested: Electronic Initial call taken by: Trixie Dredge,  August 01, 2010 4:46 PM  Follow-up for Phone Call        Rx called to pharmacy Follow-up by: Alfred Levins, CMA,  August 02, 2010 8:32 AM    Prescriptions: ATENOLOL 50 MG TABS (ATENOLOL) one daily  #90 x 3   Entered by:   Alfred Levins, CMA   Authorized by:   Birdie Sons MD   Signed by:   Alfred Levins, CMA on 08/02/2010   Method used:   Electronically to        [facility] [address] [phone]* (retail)       [address]       [place], [place]  [address]       Ph: [phone] or [phone]       Fax: [phone]   RxID:   [id number] SIMVASTATIN 10 MG TABS (SIMVASTATIN) 1 by mouth once daily  #90 x 3   Entered by:   Alfred Levins, CMA   Authorized by:   Birdie Sons MD   Signed by:   Alfred Levins, CMA on 08/02/2010   Method used:   Electronically to        [facility] [address] [phone]* (retail)       [address]       [place], [place]  [address]       Ph: [phone] or [phone]       Fax: [phone]   RxID:   [id number] NORVASC 5 MG TABS (AMLODIPINE BESYLATE) Take 1 tablet by mouth once a day  #90 x 3   Entered by:   Alfred Levins, CMA   Authorized by:   Birdie Sons MD   Signed by:   Alfred Levins, CMA on 08/02/2010   Method used:   Electronically to        [facility] [address] [phone]* (retail)       [address]       [place], [place]  [address]       Ph: [phone] or [phone]       Fax: [phone]   RxID:    [id number] MONOPRIL 40 MG TABS (FOSINOPRIL SODIUM) Take 1 tablet by mouth once a day  #90 x 3   Entered by:   Alfred Levins, CMA   Authorized by:   Birdie Sons MD   Signed by:   Alfred Levins, CMA on 08/02/2010   Method used:   Electronically to        [facility] [address] [phone]* (retail)       [address]       [place], [place]  [address]       Ph: [phone] or [phone]       Fax: [phone]   RxID:   [id number]

## 2010-11-19 ENCOUNTER — Other Ambulatory Visit (INDEPENDENT_AMBULATORY_CARE_PROVIDER_SITE_OTHER): Payer: Private Health Insurance - Indemnity | Admitting: Internal Medicine

## 2010-11-19 DIAGNOSIS — Z0000 Encounter for general adult medical examination without abnormal findings: Secondary | ICD-10-CM

## 2010-11-19 LAB — POCT URINALYSIS DIPSTICK
Ketones, UA: NEGATIVE
Leukocytes, UA: NEGATIVE
Nitrite, UA: NEGATIVE
Protein, UA: NEGATIVE
pH, UA: 5.5

## 2010-11-19 LAB — CBC WITH DIFFERENTIAL/PLATELET
Basophils Absolute: 0 10*3/uL (ref 0.0–0.1)
Basophils Relative: 0.2 % (ref 0.0–3.0)
Eosinophils Absolute: 0.1 10*3/uL (ref 0.0–0.7)
Hemoglobin: 15.1 g/dL (ref 13.0–17.0)
Lymphocytes Relative: 21.6 % (ref 12.0–46.0)
Monocytes Relative: 7 % (ref 3.0–12.0)
Neutro Abs: 5.7 10*3/uL (ref 1.4–7.7)
Neutrophils Relative %: 69.4 % (ref 43.0–77.0)
RBC: 4.77 Mil/uL (ref 4.22–5.81)

## 2010-11-19 LAB — BASIC METABOLIC PANEL
Calcium: 8.9 mg/dL (ref 8.4–10.5)
Creatinine, Ser: 1.2 mg/dL (ref 0.4–1.5)
GFR: 65.53 mL/min (ref >60.00)
Sodium: 142 mEq/L (ref 135–145)

## 2010-11-19 LAB — LIPID PANEL
HDL: 32 mg/dL — ABNORMAL LOW (ref >39.00)
Total CHOL/HDL Ratio: 5
VLDL: 29.6 mg/dL (ref 0.0–40.0)

## 2010-11-19 LAB — HEPATIC FUNCTION PANEL
AST: 23 U/L (ref 0–37)
Albumin: 4.1 g/dL (ref 3.5–5.2)
Alkaline Phosphatase: 80 U/L (ref 39–117)
Bilirubin, Direct: 0.1 mg/dL (ref 0.0–0.3)
Total Protein: 6.6 g/dL (ref 6.0–8.3)

## 2010-11-19 LAB — PSA: PSA: 0.51 ng/mL (ref 0.10–4.00)

## 2010-11-25 ENCOUNTER — Encounter: Payer: Self-pay | Admitting: Internal Medicine

## 2010-11-26 ENCOUNTER — Encounter: Payer: Self-pay | Admitting: Internal Medicine

## 2010-11-26 ENCOUNTER — Ambulatory Visit (INDEPENDENT_AMBULATORY_CARE_PROVIDER_SITE_OTHER): Payer: Private Health Insurance - Indemnity | Admitting: Internal Medicine

## 2010-11-26 VITALS — BP 132/78 | HR 68 | Temp 98.2°F | Ht 69.0 in | Wt 230.0 lb

## 2010-11-26 DIAGNOSIS — Z23 Encounter for immunization: Secondary | ICD-10-CM

## 2010-11-26 DIAGNOSIS — Z0000 Encounter for general adult medical examination without abnormal findings: Secondary | ICD-10-CM

## 2010-11-26 NOTE — Progress Notes (Signed)
  Subjective:    Patient ID: Albert Pierce, male    DOB: 15-Jan-1946, 65 y.o.   MRN: [id number]  HPI Patient is here for complete physical.  Past Medical History  Diagnosis Date  . Hyperlipidemia   . Hypertension   . Gout    Past Surgical History  Procedure Date  . Appendectomy     reports that he has been smoking.  He does not have any smokeless tobacco history on file. He reports that he drinks alcohol. His drug history not on file. family history is not on file. No Known Allergies    Review of Systems  patient denies chest pain, shortness of breath, orthopnea. Denies lower extremity edema, abdominal pain, change in appetite, change in bowel movements. Patient denies rashes, musculoskeletal complaints. No other specific complaints in a complete review of systems.      Objective:   Physical Exam Overweight male in no acute distress. HEENT exam atraumatic, normocephalic, oropharynx is moist without erythema or exudate. Tympanic membranes are clear. External auditory canals without cerumen. It is supple without lymphadenopathy or thyromegaly. No carotid bruits. Jugular venous distention is not apparent. Chest is clear to auscultation without increased work of breathing. Cardiac exam S1-S2 are regular without gallops. He does have a 2/6 systolic ejection murmur. Abdominal exam overweight, and bowel sounds, soft. Extremities no clubbing cyanosis or edema. Peripheral pulses are normal. Rectal exam normal tone enlarged prostate without masses or asymmetry.       Assessment & Plan:  Well visit. Health maintenance is up-to-date. Give him Zostavax immunization today.  Hypertension well controlled.  Obesity I encouraged him to increase exercise and decrease caloric intake.

## 2010-11-29 NOTE — Op Note (Signed)
NAME:  Albert Pierce, Albert Pierce NO.:  [id number]   MEDICAL RECORD NO.:  [id number]                   PATIENT TYPE:  AMB   LOCATION:  [facility]                                  FACILITY:  [facility]   PHYSICIAN:  Pasty Spillers. Maple Hudson, M.D.              DATE OF BIRTH:  02/23/1946   DATE OF PROCEDURE:  05/27/2002  DATE OF DISCHARGE:                                 OPERATIVE REPORT   PREOPERATIVE DIAGNOSIS:  Right superior oblique palsy.   POSTOPERATIVE DIAGNOSIS:  Right superior oblique palsy.   PROCEDURE:  Right superior oblique tuck, 10.0 mm total.   SURGEON:  Pasty Spillers. Maple Hudson, M.D.   ANESTHESIA:  General (laryngeal mask).   COMPLICATIONS:  None.   DESCRIPTION OF PROCEDURE:  After routine preoperative evaluation including  informed consent, the patient was taken to the operating room where he was  identified by me.  General anesthesia was induced without difficulty after  placement of appropriate monitors.  The patient was prepped and draped in  standard sterile fashion.  A lid speculum was placed in the right eye.   A traction suture of 6-0 silk was placed at the nasal and the temporal  limbus and this was used to draw the eye inferiorly.  A limbal conjunctival  peritomy of 2 clock hours extent was made superiorly in the right eye with  Westcott scissors with relaxing incisions in the superonasal and  superotemporal quadrants.  The right superior rectus muscle was engaged with  a series of muscle hooks.  A Desmarres retractor was placed through the  conjunctival incision and drawn posteriorly to expose the insertion of the  superior oblique tendon which was engaged on an oblique hook.  Once it had  been ascertained that the entire tendon was engaged, the tendon was engaged  on a Bishop tendon tucker and drawn up 5 mm each limb for a total tuck of  10.0 mm.  A horizontal mattress suture of 6-0 Mersilene was woven through  the ascending and descending limb of the  superior oblique tendon below the  bottom of the tucker.  The sutures were then wrapped around the two joined  limbs and tied securely.  The loop was tacked down to the insertion of the  superior oblique with a single 6-0 Mersilene suture.  The conjunctiva was  reopposed to the limbus with multiple interrupted 6-0 plain gut sutures.  TobraDex ointment was placed in the eye.  The patient was awakened without  difficulty and taken to the recovery room in stable condition, and suffered  no interoperative or immediate postoperative complications.                                               Pasty Spillers. Maple Hudson, M.D.    Cheron Schaumann  D:  05/27/2002  T:  05/27/2002  Job:  [id number]

## 2011-05-27 ENCOUNTER — Ambulatory Visit: Payer: Private Health Insurance - Indemnity | Admitting: Internal Medicine

## 2011-06-04 ENCOUNTER — Ambulatory Visit (INDEPENDENT_AMBULATORY_CARE_PROVIDER_SITE_OTHER): Payer: Private Health Insurance - Indemnity | Admitting: Internal Medicine

## 2011-06-04 ENCOUNTER — Encounter: Payer: Self-pay | Admitting: Internal Medicine

## 2011-06-04 VITALS — BP 122/70 | HR 64 | Temp 98.8°F | Ht 70.0 in | Wt 226.0 lb

## 2011-06-04 DIAGNOSIS — I10 Essential (primary) hypertension: Secondary | ICD-10-CM

## 2011-06-04 MED ORDER — COLCHICINE 0.6 MG PO TABS: 0.6000 mg | ORAL_TABLET | Freq: Three times a day (TID) | ORAL | Status: DC | PRN

## 2011-06-04 NOTE — Assessment & Plan Note (Signed)
Well controlled Continue meds 

## 2011-06-08 NOTE — Progress Notes (Signed)
htn---tolerating meds  Lipids---tolerating meds Lab Results  Component Value Date   CHOL 148 11/19/2010   CHOL 161 05/22/2010   CHOL 151 11/16/2009   Lab Results  Component Value Date   HDL 32.00* 11/19/2010   HDL 09.81* 05/22/2010   HDL 36.70* 11/16/2009   Lab Results  Component Value Date   LDLCALC 86 11/19/2010   LDLCALC 92 05/22/2010   LDLCALC 80 11/16/2009   Lab Results  Component Value Date   TRIG 148.0 11/19/2010   TRIG 172.0* 05/22/2010   TRIG 173.0* 11/16/2009   Lab Results  Component Value Date   CHOLHDL 5 11/19/2010   CHOLHDL 5 05/22/2010   CHOLHDL 4 11/16/2009   Lab Results  Component Value Date   LDLDIRECT 79.1 04/17/2009   LDLDIRECT 70.6 02/28/2008   LDLDIRECT 80.6 08/19/2007   Past Medical History  Diagnosis Date  . Hyperlipidemia   . Hypertension   . Gout     History   Social History  . Marital Status: Married    Spouse Name: N/A    Number of Children: N/A  . Years of Education: N/A   Occupational History  . Not on file.   Social History Main Topics  . Smoking status: Never Smoker   . Smokeless tobacco: Not on file  . Alcohol Use: Yes  . Drug Use: Not on file  . Sexually Active: Not on file   Other Topics Concern  . Not on file   Social History Narrative  . No narrative on file    Past Surgical History  Procedure Date  . Appendectomy     No family history on file.  No Known Allergies  Current Outpatient Prescriptions on File Prior to Visit  Medication Sig Dispense Refill  . amLODipine (NORVASC) 5 MG tablet Take 5 mg by mouth daily.        Marland Kitchen aspirin 81 MG tablet Take 81 mg by mouth daily.        Marland Kitchen atenolol (TENORMIN) 50 MG tablet Take 50 mg by mouth daily.        . fish oil-omega-3 fatty acids 1000 MG capsule Take 3 g by mouth daily.       . fosinopril (MONOPRIL) 40 MG tablet Take 40 mg by mouth daily.        . simvastatin (ZOCOR) 10 MG tablet Take 10 mg by mouth at bedtime.           patient denies chest pain, shortness of breath,  orthopnea. Denies lower extremity edema, abdominal pain, change in appetite, change in bowel movements. Patient denies rashes, musculoskeletal complaints. No other specific complaints in a complete review of systems.   BP 122/70  Pulse 64  Temp(Src) 98.8 F (37.1 C) (Oral)  Ht 5\' 10"  (1.778 m)  Wt 226 lb (102.513 kg)  BMI 32.43 kg/m2

## 2011-08-04 ENCOUNTER — Other Ambulatory Visit: Payer: Self-pay | Admitting: *Deleted

## 2011-08-04 MED ORDER — AMLODIPINE BESYLATE 5 MG PO TABS: 5.0000 mg | ORAL_TABLET | Freq: Every day | ORAL | Status: DC

## 2011-08-04 MED ORDER — FOSINOPRIL SODIUM 40 MG PO TABS: 40.0000 mg | ORAL_TABLET | Freq: Every day | ORAL | Status: DC

## 2011-08-04 MED ORDER — SIMVASTATIN 10 MG PO TABS: 10.0000 mg | ORAL_TABLET | Freq: Every day | ORAL | Status: DC

## 2011-08-04 MED ORDER — ATENOLOL 50 MG PO TABS: 50.0000 mg | ORAL_TABLET | Freq: Every day | ORAL | Status: DC

## 2011-08-11 ENCOUNTER — Telehealth: Payer: Self-pay | Admitting: Family Medicine

## 2011-08-11 NOTE — Telephone Encounter (Signed)
Pt needs refills on fosinopril (MONOPRIL) 40 MG tablet, amLODipine (NORVASC) 5 MG tablet, simvastatin (ZOCOR) 10 MG tablet, & atenolol (TENORMIN) 50 MG tablet Pt uses [facility] on [address]. Thanks. He has cpx appt in May.

## 2011-08-11 NOTE — Telephone Encounter (Signed)
rx's were sent in electronically on Thursday, told pt to contact pharmacy and see if they have them.  If not then pharmacy can send [place] the request

## 2011-11-20 ENCOUNTER — Other Ambulatory Visit: Payer: Private Health Insurance - Indemnity

## 2011-11-21 ENCOUNTER — Other Ambulatory Visit (INDEPENDENT_AMBULATORY_CARE_PROVIDER_SITE_OTHER): Payer: Private Health Insurance - Indemnity

## 2011-11-21 DIAGNOSIS — Z0000 Encounter for general adult medical examination without abnormal findings: Secondary | ICD-10-CM

## 2011-11-21 LAB — BASIC METABOLIC PANEL
CO2: 28 mEq/L (ref 19–32)
Calcium: 9 mg/dL (ref 8.4–10.5)
Creatinine, Ser: 1.1 mg/dL (ref 0.4–1.5)
GFR: 69.34 mL/min (ref >60.00)

## 2011-11-21 LAB — POCT URINALYSIS DIPSTICK
Bilirubin, UA: NEGATIVE
Ketones, UA: NEGATIVE
Leukocytes, UA: NEGATIVE

## 2011-11-21 LAB — CBC WITH DIFFERENTIAL/PLATELET
Basophils Absolute: 0 10*3/uL (ref 0.0–0.1)
Basophils Relative: 0.3 % (ref 0.0–3.0)
Eosinophils Absolute: 0.1 10*3/uL (ref 0.0–0.7)
Lymphocytes Relative: 18.5 % (ref 12.0–46.0)
MCHC: 34.3 g/dL (ref 30.0–36.0)
Neutrophils Relative %: 73.1 % (ref 43.0–77.0)
RBC: 5 Mil/uL (ref 4.22–5.81)

## 2011-11-21 LAB — HEPATIC FUNCTION PANEL
Alkaline Phosphatase: 70 U/L (ref 39–117)
Bilirubin, Direct: 0.1 mg/dL (ref 0.0–0.3)
Total Protein: 6.6 g/dL (ref 6.0–8.3)

## 2011-11-21 LAB — LIPID PANEL
Cholesterol: 125 mg/dL (ref 0–200)
HDL: 35.5 mg/dL — ABNORMAL LOW (ref >39.00)
Triglycerides: 84 mg/dL (ref 0.0–149.0)
VLDL: 16.8 mg/dL (ref 0.0–40.0)

## 2011-11-27 ENCOUNTER — Encounter: Payer: Self-pay | Admitting: Internal Medicine

## 2011-11-27 ENCOUNTER — Ambulatory Visit (INDEPENDENT_AMBULATORY_CARE_PROVIDER_SITE_OTHER): Payer: Private Health Insurance - Indemnity | Admitting: Internal Medicine

## 2011-11-27 VITALS — BP 130/84 | HR 56 | Temp 98.8°F | Resp 16 | Ht 69.0 in | Wt 219.0 lb

## 2011-11-27 DIAGNOSIS — Z0000 Encounter for general adult medical examination without abnormal findings: Secondary | ICD-10-CM

## 2011-11-27 NOTE — Progress Notes (Signed)
Patient ID: Albert Pierce, male   DOB: 03-02-1946, 66 y.o.   MRN: [id number]  cpx  Past Medical History  Diagnosis Date  . Hyperlipidemia   . Hypertension   . Gout     History   Social History  . Marital Status: Married    Spouse Name: N/A    Number of Children: N/A  . Years of Education: N/A   Occupational History  . Not on file.   Social History Main Topics  . Smoking status: Never Smoker   . Smokeless tobacco: Never Used  . Alcohol Use: 1.0 oz/week    2 drink(s) per week  . Drug Use: Not on file  . Sexually Active: Not on file   Other Topics Concern  . Not on file   Social History Narrative  . No narrative on file    Past Surgical History  Procedure Date  . Appendectomy     No family history on file.  No Known Allergies  Current Outpatient Prescriptions on File Prior to Visit  Medication Sig Dispense Refill  . amLODipine (NORVASC) 5 MG tablet Take 1 tablet (5 mg total) by mouth daily.  90 tablet  1  . aspirin 81 MG tablet Take 81 mg by mouth daily.        Marland Kitchen atenolol (TENORMIN) 50 MG tablet Take 1 tablet (50 mg total) by mouth daily.  90 tablet  1  . colchicine 0.6 MG tablet Take 1 tablet (0.6 mg total) by mouth 3 (three) times daily as needed.  20 tablet  1  . fish oil-omega-3 fatty acids 1000 MG capsule Take 3 g by mouth daily.       . fosinopril (MONOPRIL) 40 MG tablet Take 1 tablet (40 mg total) by mouth daily.  90 tablet  1  . simvastatin (ZOCOR) 10 MG tablet Take 1 tablet (10 mg total) by mouth at bedtime.  90 tablet  1     patient denies chest pain, shortness of breath, orthopnea. Denies lower extremity edema, abdominal pain, change in appetite, change in bowel movements. Patient denies rashes, musculoskeletal complaints. No other specific complaints in a complete review of systems.   BP 130/84  Pulse 56  Temp(Src) 98.8 F (37.1 C) (Oral)  Resp 16  Ht 5\' 9"  (1.753 m)  Wt 219 lb (99.338 kg)  BMI 32.34 kg/m2 Well-developed male in no acute  distress. HEENT exam atraumatic, normocephalic, extraocular muscles are intact. Conjunctivae are pink without exudate. Neck is supple without lymphadenopathy, thyromegaly, jugular venous distention. Chest is clear to auscultation without increased work of breathing. Cardiac exam S1-S2 are regular 2/6 hsm. The PMI is normal. No significant murmurs or gallops. Abdominal exam active bowel sounds, soft, nontender. No abdominal bruits. Extremities no clubbing cyanosis or edema. Peripheral pulses are normal without bruits. Neurologic exam alert and oriented without any motor or sensory deficits. Rectal exam normal tone prostate normal size without masses or asymmetry.   Well visit- Note gradual weight loss-- continue weight loss.

## 2012-01-26 ENCOUNTER — Other Ambulatory Visit: Payer: Self-pay | Admitting: Internal Medicine

## 2012-07-27 ENCOUNTER — Other Ambulatory Visit: Payer: Self-pay | Admitting: Internal Medicine

## 2012-11-15 ENCOUNTER — Encounter: Payer: Self-pay | Admitting: Gastroenterology

## 2012-11-19 ENCOUNTER — Encounter: Payer: Self-pay | Admitting: Gastroenterology

## 2012-12-22 ENCOUNTER — Other Ambulatory Visit (INDEPENDENT_AMBULATORY_CARE_PROVIDER_SITE_OTHER): Payer: BC Managed Care – PPO

## 2012-12-22 DIAGNOSIS — Z0000 Encounter for general adult medical examination without abnormal findings: Secondary | ICD-10-CM

## 2012-12-22 LAB — HEPATIC FUNCTION PANEL
ALT: 23 U/L (ref 0–53)
Albumin: 4 g/dL (ref 3.5–5.2)
Total Bilirubin: 1 mg/dL (ref 0.3–1.2)

## 2012-12-22 LAB — PSA: PSA: 0.58 ng/mL (ref 0.10–4.00)

## 2012-12-22 LAB — POCT URINALYSIS DIPSTICK
Ketones, UA: NEGATIVE
Leukocytes, UA: NEGATIVE
Nitrite, UA: NEGATIVE
Protein, UA: NEGATIVE

## 2012-12-22 LAB — CBC WITH DIFFERENTIAL/PLATELET
Basophils Absolute: 0 10*3/uL (ref 0.0–0.1)
HCT: 44 % (ref 39.0–52.0)
Lymphs Abs: 1.5 10*3/uL (ref 0.7–4.0)
MCV: 89.2 fl (ref 78.0–100.0)
Monocytes Absolute: 0.5 10*3/uL (ref 0.1–1.0)
Neutro Abs: 5.9 10*3/uL (ref 1.4–7.7)
Platelets: 236 10*3/uL (ref 150.0–400.0)
RDW: 13.7 % (ref 11.5–14.6)

## 2012-12-22 LAB — LIPID PANEL
Cholesterol: 164 mg/dL (ref 0–200)
VLDL: 32.4 mg/dL (ref 0.0–40.0)

## 2012-12-22 LAB — BASIC METABOLIC PANEL
BUN: 15 mg/dL (ref 6–23)
CO2: 28 mEq/L (ref 19–32)
Chloride: 108 mEq/L (ref 96–112)
Glucose, Bld: 92 mg/dL (ref 70–99)
Potassium: 3.9 mEq/L (ref 3.5–5.1)

## 2012-12-29 ENCOUNTER — Ambulatory Visit (INDEPENDENT_AMBULATORY_CARE_PROVIDER_SITE_OTHER): Payer: BC Managed Care – PPO | Admitting: Internal Medicine

## 2012-12-29 ENCOUNTER — Encounter: Payer: Self-pay | Admitting: Internal Medicine

## 2012-12-29 VITALS — BP 130/88 | HR 60 | Temp 98.3°F | Ht 69.0 in | Wt 217.0 lb

## 2012-12-29 DIAGNOSIS — Z23 Encounter for immunization: Secondary | ICD-10-CM

## 2012-12-29 DIAGNOSIS — Z0000 Encounter for general adult medical examination without abnormal findings: Secondary | ICD-10-CM

## 2012-12-30 NOTE — Progress Notes (Signed)
Patient ID: Albert Pierce, male   DOB: 12/05/1945, 67 y.o.   MRN: [id number]  cpx  Past Medical History  Diagnosis Date  . Hyperlipidemia   . Hypertension   . Gout     History   Social History  . Marital Status: Married    Spouse Name: N/A    Number of Children: N/A  . Years of Education: N/A   Occupational History  . Not on file.   Social History Main Topics  . Smoking status: Never Smoker   . Smokeless tobacco: Never Used  . Alcohol Use: 1.0 oz/week    2 drink(s) per week  . Drug Use: Not on file  . Sexually Active: Not on file   Other Topics Concern  . Not on file   Social History Narrative  . No narrative on file    Past Surgical History  Procedure Laterality Date  . Appendectomy      No family history on file.  No Known Allergies  Current Outpatient Prescriptions on File Prior to Visit  Medication Sig Dispense Refill  . amLODipine (NORVASC) 5 MG tablet TAKE 1 TABLET (5 MG TOTAL) BY MOUTH DAILY.  90 tablet  1  . aspirin 81 MG tablet Take 81 mg by mouth daily.        Marland Kitchen atenolol (TENORMIN) 50 MG tablet TAKE 1 TABLET BY MOUTH DAILY  90 tablet  1  . colchicine 0.6 MG tablet Take 1 tablet (0.6 mg total) by mouth 3 (three) times daily as needed.  20 tablet  1  . fish oil-omega-3 fatty acids 1000 MG capsule Take 3 g by mouth daily.       . fosinopril (MONOPRIL) 40 MG tablet TAKE 1 TABLET (40 MG TOTAL) BY MOUTH DAILY.  90 tablet  1  . simvastatin (ZOCOR) 10 MG tablet TAKE 1 TABLET BY MOUTH EVERY DAY  90 tablet  1  . amLODipine (NORVASC) 5 MG tablet TAKE 1 TABLET (5 MG TOTAL) BY MOUTH DAILY.  90 tablet  1  . amLODipine (NORVASC) 5 MG tablet TAKE 1 TABLET (5 MG TOTAL) BY MOUTH DAILY.  90 tablet  1  . amLODipine (NORVASC) 5 MG tablet TAKE 1 TABLET (5 MG TOTAL) BY MOUTH DAILY.  90 tablet  1  . atenolol (TENORMIN) 50 MG tablet TAKE 1 TABLET (50 MG TOTAL) BY MOUTH DAILY.  90 tablet  1  . atenolol (TENORMIN) 50 MG tablet TAKE 1 TABLET BY MOUTH DAILY  90 tablet  1  .  atenolol (TENORMIN) 50 MG tablet TAKE 1 TABLET (50 MG TOTAL) BY MOUTH DAILY.  90 tablet  1  . atenolol (TENORMIN) 50 MG tablet TAKE 1 TABLET BY MOUTH DAILY  90 tablet  1  . atenolol (TENORMIN) 50 MG tablet TAKE 1 TABLET (50 MG TOTAL) BY MOUTH DAILY.  90 tablet  1  . fosinopril (MONOPRIL) 40 MG tablet TAKE 1 TABLET (40 MG TOTAL) BY MOUTH DAILY.  90 tablet  1  . fosinopril (MONOPRIL) 40 MG tablet TAKE 1 TABLET (40 MG TOTAL) BY MOUTH DAILY.  90 tablet  1  . fosinopril (MONOPRIL) 40 MG tablet TAKE 1 TABLET (40 MG TOTAL) BY MOUTH DAILY.  90 tablet  1  . simvastatin (ZOCOR) 10 MG tablet TAKE 1 TABLET (10 MG TOTAL) BY MOUTH AT BEDTIME.  90 tablet  1  . simvastatin (ZOCOR) 10 MG tablet TAKE 1 TABLET (10 MG TOTAL) BY MOUTH AT BEDTIME.  90 tablet  1  .  simvastatin (ZOCOR) 10 MG tablet TAKE 1 TABLET BY MOUTH EVERY DAY  90 tablet  1  . simvastatin (ZOCOR) 10 MG tablet TAKE 1 TABLET BY MOUTH EVERY DAY  90 tablet  1  . simvastatin (ZOCOR) 10 MG tablet TAKE 1 TABLET (10 MG TOTAL) BY MOUTH AT BEDTIME.  90 tablet  1   No current facility-administered medications on file prior to visit.     patient denies chest pain, shortness of breath, orthopnea. Denies lower extremity edema, abdominal pain, change in appetite, change in bowel movements. Patient denies rashes, musculoskeletal complaints. No other specific complaints in a complete review of systems.   BP 130/88  Pulse 60  Temp(Src) 98.3 F (36.8 C) (Oral)  Ht 5\' 9"  (1.753 m)  Wt 217 lb (98.431 kg)  BMI 32.03 kg/m2  well-developed well-nourished male in no acute distress. HEENT exam atraumatic, normocephalic, neck supple without jugular venous distention. Chest clear to auscultation cardiac exam S1-S2 are regular. Abdominal exam overweight with bowel sounds, soft and nontender. Extremities no edema. Neurologic exam is alert with a normal gait.   Well Visit- health maint UTD

## 2013-01-04 ENCOUNTER — Ambulatory Visit (AMBULATORY_SURGERY_CENTER): Payer: BC Managed Care – PPO

## 2013-01-04 VITALS — Ht 70.0 in | Wt 219.8 lb

## 2013-01-04 DIAGNOSIS — Z1211 Encounter for screening for malignant neoplasm of colon: Secondary | ICD-10-CM

## 2013-01-04 MED ORDER — MOVIPREP 100 G PO SOLR: ORAL | Status: DC

## 2013-01-05 ENCOUNTER — Encounter: Payer: Self-pay | Admitting: Gastroenterology

## 2013-01-18 ENCOUNTER — Ambulatory Visit (AMBULATORY_SURGERY_CENTER): Payer: BC Managed Care – PPO | Admitting: Gastroenterology

## 2013-01-18 ENCOUNTER — Encounter: Payer: Self-pay | Admitting: Gastroenterology

## 2013-01-18 VITALS — BP 126/78 | HR 44 | Temp 98.0°F | Resp 20 | Ht 70.0 in | Wt 219.0 lb

## 2013-01-18 DIAGNOSIS — Z1211 Encounter for screening for malignant neoplasm of colon: Secondary | ICD-10-CM

## 2013-01-18 MED ORDER — SODIUM CHLORIDE 0.9 % IV SOLN: 500.0000 mL | INTRAVENOUS | Status: DC

## 2013-01-18 NOTE — Patient Instructions (Addendum)

## 2013-01-18 NOTE — Op Note (Signed)
[facility] [address] [place], [address]   COLONOSCOPY PROCEDURE REPORT  PATIENT: Albert Pierce, Albert Pierce  MR#: [id number] BIRTHDATE: [birth year] , 66  yrs. old GENDER: Male ENDOSCOPIST: Meryl Dare, MD, [facility] PROCEDURE DATE:  01/18/2013 PROCEDURE:   Colonoscopy, screening First Screening Colonoscopy - Avg.  risk and is 50 yrs.  old or older - No.  Prior Negative Screening - Now for repeat screening. Greater than 10 yrs  History of Adenoma - Now for follow-up colonoscopy & has been > or = to 3 yrs.  N/A  Polyps Removed Today? Yes. ASA CLASS:   Class II INDICATIONS: average risk screening, and Last colonoscopy performed 10 years ago. MEDICATIONS: MAC sedation, administered by CRNA and propofol (Diprivan) 200mg  IV DESCRIPTION OF PROCEDURE:   After the risks benefits and alternatives of the procedure were thoroughly explained, informed consent was obtained.  A digital rectal exam revealed no abnormalities of the rectum.   The [id number]  endoscope was introduced through the anus and advanced to the cecum, which was identified by both the appendix and ileocecal valve. No adverse events experienced.   The quality of the prep was good, using MoviPrep  The instrument was then slowly withdrawn as the colon was fully examined.  COLON FINDINGS: A normal appearing cecum, ileocecal valve, and appendiceal orifice were identified.  The ascending, hepatic flexure, transverse, splenic flexure, descending, sigmoid colon and rectum appeared unremarkable.  No polyps or cancers were seen. Retroflexed views revealed no abnormalities. The time to cecum=3 minutes 33 seconds.  Withdrawal time=9 minutes 36 seconds.  The scope was withdrawn and the procedure completed.  COMPLICATIONS: There were no complications.  ENDOSCOPIC IMPRESSION: 1.  Normal colon  RECOMMENDATIONS: 1.  Continue current colorectal screening recommendations for "routine risk" patients with a repeat  colonoscopy in 10 years.   eSigned:  Meryl Dare, MD, [facility] 01/18/2013 1:42 PM

## 2013-01-18 NOTE — Progress Notes (Signed)
Patient did not have preoperative order for IV antibiotic SSI prophylaxis. (G8918)  Patient did not experience any of the following events: a burn prior to discharge; a fall within the facility; wrong site/side/patient/procedure/implant event; or a hospital transfer or hospital admission upon discharge from the facility. (G8907)  

## 2013-01-19 ENCOUNTER — Telehealth: Payer: Self-pay | Admitting: *Deleted

## 2013-01-19 ENCOUNTER — Other Ambulatory Visit: Payer: Self-pay | Admitting: Internal Medicine

## 2013-01-19 NOTE — Telephone Encounter (Signed)
  Follow up Call-  Call back number 01/18/2013  Post procedure Call Back phone  # [phone]  Permission to leave phone message Yes     Patient questions:  Do you have a fever, pain , or abdominal swelling? no Pain Score  0 *  Have you tolerated food without any problems? yes  Have you been able to return to your normal activities? yes  Do you have any questions about your discharge instructions: Diet   no Medications  no Follow up visit  no  Do you have questions or concerns about your Care? no  Actions: * If pain score is 4 or above: No action needed, pain <4.

## 2013-02-01 ENCOUNTER — Telehealth: Payer: Self-pay | Admitting: Internal Medicine

## 2013-02-01 MED ORDER — ATENOLOL 50 MG PO TABS: ORAL_TABLET | ORAL | Status: DC

## 2013-02-01 NOTE — Telephone Encounter (Signed)
rx sent in electronically 

## 2013-02-01 NOTE — Telephone Encounter (Signed)
atenolol (TENORMIN) 50 MG tablet 1 / day 90 day w/ one refill [facility] [address] [place] [address]

## 2013-05-19 ENCOUNTER — Other Ambulatory Visit: Payer: Self-pay

## 2013-07-26 ENCOUNTER — Other Ambulatory Visit: Payer: Self-pay | Admitting: Internal Medicine

## 2013-08-25 ENCOUNTER — Other Ambulatory Visit: Payer: Self-pay | Admitting: Internal Medicine

## 2013-10-28 ENCOUNTER — Other Ambulatory Visit: Payer: Self-pay | Admitting: Internal Medicine

## 2013-11-01 ENCOUNTER — Other Ambulatory Visit: Payer: Self-pay | Admitting: Internal Medicine

## 2013-11-03 ENCOUNTER — Telehealth: Payer: Self-pay | Admitting: Internal Medicine

## 2013-11-03 MED ORDER — FOSINOPRIL SODIUM 40 MG PO TABS: ORAL_TABLET | ORAL | Status: DC

## 2013-11-03 MED ORDER — AMLODIPINE BESYLATE 5 MG PO TABS: ORAL_TABLET | ORAL | Status: DC

## 2013-11-03 MED ORDER — SIMVASTATIN 10 MG PO TABS: ORAL_TABLET | ORAL | Status: DC

## 2013-11-03 MED ORDER — ATENOLOL 50 MG PO TABS: ORAL_TABLET | ORAL | Status: DC

## 2013-11-03 NOTE — Telephone Encounter (Signed)
[facility] - [place], [place] - [address] is requesting  re-fills on the following:  fosinopril (MONOPRIL) 40 MG tablet  amLODipine (NORVASC) 5 MG tablet simvastatin (ZOCOR) 10 MG tablet atenolol (TENORMIN) 50 MG tablet

## 2013-11-03 NOTE — Telephone Encounter (Signed)
rx sent in electronically 

## 2014-02-09 ENCOUNTER — Other Ambulatory Visit: Payer: Self-pay | Admitting: Internal Medicine

## 2014-02-13 ENCOUNTER — Telehealth: Payer: Self-pay | Admitting: Internal Medicine

## 2014-02-13 NOTE — Telephone Encounter (Signed)
Opened in error/appt sch

## 2014-02-14 ENCOUNTER — Encounter: Payer: Self-pay | Admitting: Physician Assistant

## 2014-02-14 ENCOUNTER — Ambulatory Visit (INDEPENDENT_AMBULATORY_CARE_PROVIDER_SITE_OTHER): Payer: Medicare Other | Admitting: Physician Assistant

## 2014-02-14 VITALS — BP 110/70 | HR 42 | Temp 98.9°F | Resp 18 | Ht 69.5 in | Wt 215.0 lb

## 2014-02-14 DIAGNOSIS — Z0000 Encounter for general adult medical examination without abnormal findings: Secondary | ICD-10-CM

## 2014-02-14 DIAGNOSIS — E785 Hyperlipidemia, unspecified: Secondary | ICD-10-CM

## 2014-02-14 DIAGNOSIS — M109 Gout, unspecified: Secondary | ICD-10-CM

## 2014-02-14 DIAGNOSIS — I10 Essential (primary) hypertension: Secondary | ICD-10-CM | POA: Diagnosis not present

## 2014-02-14 LAB — POCT URINALYSIS DIPSTICK
Bilirubin, UA: NEGATIVE
Blood, UA: NEGATIVE
Glucose, UA: NEGATIVE
Ketones, UA: NEGATIVE
Leukocytes, UA: NEGATIVE
Nitrite, UA: NEGATIVE
Protein, UA: NEGATIVE
Spec Grav, UA: 1.015
Urobilinogen, UA: 0.2
pH, UA: 5.5

## 2014-02-14 LAB — CBC WITH DIFFERENTIAL/PLATELET
BASOS PCT: 0.2 % (ref 0.0–3.0)
Basophils Absolute: 0 10*3/uL (ref 0.0–0.1)
EOS PCT: 1.7 % (ref 0.0–5.0)
Eosinophils Absolute: 0.1 10*3/uL (ref 0.0–0.7)
HCT: 42.8 % (ref 39.0–52.0)
Hemoglobin: 14.9 g/dL (ref 13.0–17.0)
LYMPHS PCT: 22.3 % (ref 12.0–46.0)
Lymphs Abs: 1.9 10*3/uL (ref 0.7–4.0)
MCHC: 34.7 g/dL (ref 30.0–36.0)
MCV: 88.3 fl (ref 78.0–100.0)
Monocytes Absolute: 0.6 10*3/uL (ref 0.1–1.0)
Monocytes Relative: 7.4 % (ref 3.0–12.0)
Neutro Abs: 5.9 10*3/uL (ref 1.4–7.7)
Neutrophils Relative %: 68.4 % (ref 43.0–77.0)
Platelets: 209 10*3/uL (ref 150.0–400.0)
RBC: 4.85 Mil/uL (ref 4.22–5.81)
RDW: 13.8 % (ref 11.5–15.5)
WBC: 8.6 10*3/uL (ref 4.0–10.5)

## 2014-02-14 LAB — HEPATIC FUNCTION PANEL
ALT: 20 U/L (ref 0–53)
AST: 23 U/L (ref 0–37)
Albumin: 4.1 g/dL (ref 3.5–5.2)
Alkaline Phosphatase: 73 U/L (ref 39–117)
Bilirubin, Direct: 0.2 mg/dL (ref 0.0–0.3)
Total Bilirubin: 1.4 mg/dL — ABNORMAL HIGH (ref 0.2–1.2)
Total Protein: 7.2 g/dL (ref 6.0–8.3)

## 2014-02-14 LAB — BASIC METABOLIC PANEL WITH GFR
BUN: 19 mg/dL (ref 6–23)
CO2: 30 meq/L (ref 19–32)
Calcium: 9.1 mg/dL (ref 8.4–10.5)
Chloride: 103 meq/L (ref 96–112)
Creatinine, Ser: 1.1 mg/dL (ref 0.4–1.5)
GFR: 68.86 mL/min
Glucose, Bld: 86 mg/dL (ref 70–99)
Potassium: 3.8 meq/L (ref 3.5–5.1)
Sodium: 139 meq/L (ref 135–145)

## 2014-02-14 LAB — LIPID PANEL
Cholesterol: 164 mg/dL (ref 0–200)
HDL: 35.3 mg/dL — ABNORMAL LOW (ref >39.00)
LDL Cholesterol: 102 mg/dL — ABNORMAL HIGH (ref 0–99)
NonHDL: 128.7
Total CHOL/HDL Ratio: 5
Triglycerides: 132 mg/dL (ref 0.0–149.0)
VLDL: 26.4 mg/dL (ref 0.0–40.0)

## 2014-02-14 MED ORDER — ATENOLOL 25 MG PO TABS: 25.0000 mg | ORAL_TABLET | Freq: Every day | ORAL | Status: DC

## 2014-02-14 MED ORDER — SIMVASTATIN 10 MG PO TABS: ORAL_TABLET | ORAL | Status: DC

## 2014-02-14 MED ORDER — AMLODIPINE BESYLATE 5 MG PO TABS: ORAL_TABLET | ORAL | Status: DC

## 2014-02-14 MED ORDER — FOSINOPRIL SODIUM 40 MG PO TABS: ORAL_TABLET | ORAL | Status: DC

## 2014-02-14 NOTE — Assessment & Plan Note (Signed)
Stable on current regimen. Will continue.

## 2014-02-14 NOTE — Assessment & Plan Note (Signed)
No acute flares for years. Flares in the past well controlled with prn colchicine. Will continue.

## 2014-02-14 NOTE — Assessment & Plan Note (Addendum)
Stable on current medication regimen. Bradycardia at 42. Will cut atenolol dose in half. Continue others.

## 2014-02-14 NOTE — Progress Notes (Signed)
Pre visit review using our clinic review tool, if applicable. No additional management support is needed unless otherwise documented below in the visit note. 

## 2014-02-14 NOTE — Patient Instructions (Addendum)
We will call you with your lab results when available.  We are halving the dose of your atenolol due to your slow heart rate. We will follow up in about 1 month to recheck your BP with this change.  Continue Current medications as directed.  Follow up as needed for any additional concerns.   Cardiac Diet A cardiac diet can help stop heart disease or a stroke from happening. It involves eating less unhealthy fats and eating more healthy fats.  FOODS TO AVOID OR LIMIT  Limit saturated fats. This type of fat is found in oils and dairy products, such as:  Coconut oil.  Palm oil.  Cocoa butter.  Butter.  Avoid trans-fat or hydrogenated oils. These are found in fried or pre-made baked goods, such as:  Margarine.  Pre-made cookies, cakes, and crackers.  Limit processed meats (hot dogs, deli meats, sausage) to 3 ounces a week.  Limit high-fat meats (marbled meats, fried chicken, or chicken with skin) to 3 ounces a week.  Limit salt (sodium) to 1500 milligrams a day.   Limit sweets and drinks with added sugar to no more than 5 servings a week. One serving is:  1 tablespoon of sugar.  1 tablespoon of jelly or jam.   cup sorbet.  1 cup lemonade.   cup regular soda. EAT MORE OF THE FOLLOWING FOODS Fruit  Eat 4to 5 servings a day. One serving of fruit is:  1 medium whole fruit.   cup dried fruit.   cup of fresh, frozen, or canned fruit.   cup 100% fruit juice. Vegetables  Eat 4 to 5 servings a day. One serving is:  1 cup raw leafy vegetables.   cup raw or cooked, cut-up vegetables.   cup vegetable juice. Whole Grains  Eat 3 servings a day (1 ounce equals 1 serving). Legumes (such as beans, peas, and lentils)   Eat at least 4 servings a week ( cup equals 1 serving). Nuts and Seeds   Eat at least 4 servings a week ( cup equals 1 serving). Dietary Fiber  Eat 20 to 30 grams a day. Some foods high in dietary fiber include:  Dried  beans.  Citrus fruits.  Apples, bananas.  Broccoli, Brussels sprouts, and eggplant.  Oats. Omega-3 Fats  Eat food with omega-3 fats. You can also take a dietary pill (supplement) that has 1 gram of DHA and EPA. Have 3.5 ounces of fatty fish a week, such as:  Salmon.  Mackerel.  [place] tuna.  Sardines.  Lake trout.  Herring. PREPARING YOUR FOOD  Broil, bake, steam, or roast foods. Do not fry food. Do not cook food in butter (fat).  Use non-stick cooking sprays.  Remove skin from poultry, such as chicken and [place].  Remove fat from meat.  Take the fat off the top of stews, soups, and gravy.  Use lemon or herbs to flavor food instead of using butter or margarine.  Use nonfat yogurt, salsa, or low-fat dressings for salads. Document Released: 12/30/2011 Document Reviewed: 12/30/2011 [facility] Patient Information 2015 [place]. This information is not intended to replace advice given to you by your health care provider. Make sure you discuss any questions you have with your health care provider. Health Maintenance A healthy lifestyle and preventative care can promote health and wellness.  Maintain regular health, dental, and eye exams.  Eat a healthy diet. Foods like vegetables, fruits, whole grains, low-fat dairy products, and lean protein foods contain the nutrients you need and are  low in calories. Decrease your intake of foods high in solid fats, added sugars, and salt. Get information about a proper diet from your health care provider, if necessary.  Regular physical exercise is one of the most important things you can do for your health. Most adults should get at least 150 minutes of moderate-intensity exercise (any activity that increases your heart rate and causes you to sweat) each week. In addition, most adults need muscle-strengthening exercises on 2 or more days a week.   Maintain a healthy weight. The body mass index (BMI) is a screening tool to  identify possible weight problems. It provides an estimate of body fat based on height and weight. Your health care provider can find your BMI and can help you achieve or maintain a healthy weight. For males 20 years and older:  A BMI below 18.5 is considered underweight.  A BMI of 18.5 to 24.9 is normal.  A BMI of 25 to 29.9 is considered overweight.  A BMI of 30 and above is considered obese.  Maintain normal blood lipids and cholesterol by exercising and minimizing your intake of saturated fat. Eat a balanced diet with plenty of fruits and vegetables. Blood tests for lipids and cholesterol should begin at age 80 and be repeated every 5 years. If your lipid or cholesterol levels are high, you are over age 60, or you are at high risk for heart disease, you may need your cholesterol levels checked more frequently.Ongoing high lipid and cholesterol levels should be treated with medicines if diet and exercise are not working.  If you smoke, find out from your health care provider how to quit. If you do not use tobacco, do not start.  Lung cancer screening is recommended for adults aged 26-80 years who are at high risk for developing lung cancer because of a history of smoking. A yearly low-dose CT scan of the lungs is recommended for people who have at least a 30-pack-year history of smoking and are current smokers or have quit within the past 15 years. A pack year of smoking is smoking an average of 1 pack of cigarettes a day for 1 year (for example, a 30-pack-year history of smoking could mean smoking 1 pack a day for 30 years or 2 packs a day for 15 years). Yearly screening should continue until the smoker has stopped smoking for at least 15 years. Yearly screening should be stopped for people who develop a health problem that would prevent them from having lung cancer treatment.  If you choose to drink alcohol, do not have more than 2 drinks per day. One drink is considered to be 12 oz (360 mL) of  beer, 5 oz (150 mL) of wine, or 1.5 oz (45 mL) of liquor.  Avoid the use of street drugs. Do not share needles with anyone. Ask for help if you need support or instructions about stopping the use of drugs.  High blood pressure causes heart disease and increases the risk of stroke. Blood pressure should be checked at least every 1-2 years. Ongoing high blood pressure should be treated with medicines if weight loss and exercise are not effective.  If you are 42-69 years old, ask your health care provider if you should take aspirin to prevent heart disease.  Diabetes screening involves taking a blood sample to check your fasting blood sugar level. This should be done once every 3 years after age 65 if you are at a normal weight and without risk factors  for diabetes. Testing should be considered at a younger age or be carried out more frequently if you are overweight and have at least 1 risk factor for diabetes.  Colorectal cancer can be detected and often prevented. Most routine colorectal cancer screening begins at the age of 94 and continues through age 24. However, your health care provider may recommend screening at an earlier age if you have risk factors for colon cancer. On a yearly basis, your health care provider may provide home test kits to check for hidden blood in the stool. A small camera at the end of a tube may be used to directly examine the colon (sigmoidoscopy or colonoscopy) to detect the earliest forms of colorectal cancer. Talk to your health care provider about this at age 58 when routine screening begins. A direct exam of the colon should be repeated every 5-10 years through age 43, unless early forms of precancerous polyps or small growths are found.  People who are at an increased risk for hepatitis B should be screened for this virus. You are considered at high risk for hepatitis B if:  You were born in a country where hepatitis B occurs often. Talk with your health care provider  about which countries are considered high risk.  Your parents were born in a high-risk country and you have not received a shot to protect against hepatitis B (hepatitis B vaccine).  You have HIV or AIDS.  You use needles to inject street drugs.  You live with, or have sex with, someone who has hepatitis B.  You are a man who has sex with other men (MSM).  You get hemodialysis treatment.  You take certain medicines for conditions like cancer, organ transplantation, and autoimmune conditions.  Hepatitis C blood testing is recommended for all people born from [birth year] through 1964 and any individual with known risk factors for hepatitis C.  Healthy men should no longer receive prostate-specific antigen (PSA) blood tests as part of routine cancer screening. Talk to your health care provider about prostate cancer screening.  Testicular cancer screening is not recommended for adolescents or adult males who have no symptoms. Screening includes self-exam, a health care provider exam, and other screening tests. Consult with your health care provider about any symptoms you have or any concerns you have about testicular cancer.  Practice safe sex. Use condoms and avoid high-risk sexual practices to reduce the spread of sexually transmitted infections (STIs).  You should be screened for STIs, including gonorrhea and chlamydia if:  You are sexually active and are younger than 24 years.  You are older than 24 years, and your health care provider tells you that you are at risk for this type of infection.  Your sexual activity has changed since you were last screened, and you are at an increased risk for chlamydia or gonorrhea. Ask your health care provider if you are at risk.  If you are at risk of being infected with HIV, it is recommended that you take a prescription medicine daily to prevent HIV infection. This is called pre-exposure prophylaxis (PrEP). You are considered at risk if:  You are a  man who has sex with other men (MSM).  You are a heterosexual man who is sexually active with multiple partners.  You take drugs by injection.  You are sexually active with a partner who has HIV.  Talk with your health care provider about whether you are at high risk of being infected with HIV. If you choose  to begin PrEP, you should first be tested for HIV. You should then be tested every 3 months for as long as you are taking PrEP.  Use sunscreen. Apply sunscreen liberally and repeatedly throughout the day. You should seek shade when your shadow is shorter than you. Protect yourself by wearing long sleeves, pants, a wide-brimmed hat, and sunglasses year round whenever you are outdoors.  Tell your health care provider of new moles or changes in moles, especially if there is a change in shape or color. Also, tell your health care provider if a mole is larger than the size of a pencil eraser.  A one-time screening for abdominal aortic aneurysm (AAA) and surgical repair of large AAAs by ultrasound is recommended for men aged 68-75 years who are current or former smokers.  Stay current with your vaccines (immunizations). Document Released: 12/27/2007 Document Revised: 07/05/2013 Document Reviewed: 11/25/2010 [facility] Patient Information 2015 [place], [place]. This information is not intended to replace advice given to you by your health care provider. Make sure you discuss any questions you have with your health care provider.

## 2014-02-14 NOTE — Progress Notes (Signed)
Subjective:    Patient ID: Albert Pierce, male    DOB: 1945/10/02, 68 y.o.   MRN: [id number]  HPI Patient presents to clinic today to establish care.  Acute Concerns: Annual Exam,   Intro to [facility].  Chronic Issues: HTN- Stable on amlodipine, atenolol, and fosinopril.  GOUT- Pt occasionally ha a gout flare up. States he hasn't had one in years. He has Colchicine on hand to take as needed for his flare ups when they do occur.  HLD- Stable on simvastatin.  Also takes a Baby aspirin every night which he was directed to do by his last PCP to prevent MI and Stroke.  Health Maintenance: Dental -- Has dentisit, Albert Pierce, sees every 6 months. Vision -- No eye problems, only wears reading glasses Immunizations -- UTD Colonoscopy -- UTD due in 2024.   1. Risk factors, based on past  M,S,F history: HLD, HTN  2.  Physical activities: States he walks quite a bit. Walks his dog and does yard work frequently. Feels he is frequently very active. He does not have a regular workout routine.  3.  Depression/mood: None, Pt denies hopelessness, helplessness, SI, HI, or history thereof.  4.  Hearing: No Major Deficits  5.  ADL's: Independent in all aspects of daily living  6.  Fall risk: Low  7.  Home safety: No problems identified.  8.  Height weight, and visual acuity: height and weight stable, vision grossly normal, has reading glasses.  9.  Counseling: regular exercise and heart healthy diet.  10. Lab orders based on risk factors: Laboratory profile discussed and appropriate tests ordered.  11. Referral: None needed.  12. Care plan: Continue Current Regimen.  13. Cognitive assessment: Alert and oriented with normal affect no cognitive dysfunction  14. Screening: Health Maintenance UTD.  15. Provider List Update:  PCP was Dr. Leanne Pierce.    Review of Systems Patient denies fevers, chills, nausea, vomiting, diarrhea, chest pain, shortness of breath, orthopnea, headache,  syncope. Denies lower extremity edema, abdominal pain, change in appetite, change in bowel movements. Patient denies rashes, musculoskeletal complaints. No other specific complaints in a complete review of systems.    Past Medical History  Diagnosis Date  . Hyperlipidemia   . Hypertension   . Gout     History   Social History  . Marital Status: Married    Spouse Name: N/A    Number of Children: N/A  . Years of Education: N/A   Occupational History  . Not on file.   Social History Main Topics  . Smoking status: Never Smoker   . Smokeless tobacco: Never Used  . Alcohol Use: 1.2 oz/week    2 Glasses of wine per week  . Drug Use: No  . Sexual Activity: Not on file   Other Topics Concern  . Not on file   Social History Narrative  . No narrative on file    Past Surgical History  Procedure Laterality Date  . Appendectomy    . Colonoscopy      History reviewed. No pertinent family history.  No Known Allergies  Current Outpatient Prescriptions on File Prior to Visit  Medication Sig Dispense Refill  . aspirin 81 MG tablet Take 81 mg by mouth daily.        . fish oil-omega-3 fatty acids 1000 MG capsule Take 3 g by mouth daily.       . colchicine 0.6 MG tablet Take 1 tablet (0.6 mg total) by mouth 3 (three)  times daily as needed.  20 tablet  1   No current facility-administered medications on file prior to visit.   The PFS was reviewed at the time of visit.  EXAM: BP 110/70  Pulse 42  Temp(Src) 98.9 F (37.2 C) (Oral)  Resp 18  Ht 5' 9.5" (1.765 m)  Wt 215 lb (97.523 kg)  BMI 31.31 kg/m2       Objective:   Physical Exam  Nursing note and vitals reviewed. Constitutional: He is oriented to person, place, and time. He appears well-developed and well-nourished. No distress.  HENT:  Head: Normocephalic and atraumatic.  Right Ear: External ear normal.  Left Ear: External ear normal.  Nose: Nose normal.  Mouth/Throat: Oropharynx is clear and moist. No  oropharyngeal exudate.  Eyes: Conjunctivae and EOM are normal. Pupils are equal, round, and reactive to light. Right eye exhibits no discharge. Left eye exhibits no discharge. No scleral icterus.  Vision grossly intact.  Neck: Normal range of motion. Neck supple. No JVD present. No tracheal deviation present. No thyromegaly present.  Cardiovascular: Regular rhythm, normal heart sounds and intact distal pulses.  Exam reveals no gallop and no friction rub.   No murmur heard. Bradycardia.  Pulmonary/Chest: Effort normal and breath sounds normal. No stridor. No respiratory distress. He has no wheezes. He has no rales. He exhibits no tenderness.  Abdominal: Soft. Bowel sounds are normal. He exhibits no distension and no mass. There is no tenderness. There is no rebound and no guarding.  Genitourinary:  Declined.  Musculoskeletal: Normal range of motion. He exhibits no edema and no tenderness.  Lymphadenopathy:    He has no cervical adenopathy.  Neurological: He is alert and oriented to person, place, and time. He has normal reflexes. No cranial nerve deficit. He exhibits normal muscle tone. Coordination normal.  Skin: Skin is warm and dry. No rash noted. He is not diaphoretic. No erythema. No pallor.  Psychiatric: He has a normal mood and affect. His behavior is normal. Judgment and thought content normal.    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       Assessment & Plan:  Albert Pierce was seen today for establish care.  Diagnoses and associated orders for this visit:  Welcome to Medicare preventive visit - EKG 12-Lead - CBC with Differential - Basic Metabolic Panel - Hepatic Function Panel - Lipid Panel - POC Urinalysis Dipstick  HYPERTENSION - CBC with Differential - Lipid Panel - atenolol (TENORMIN) 25 MG tablet; Take 1 tablet (25 mg total) by mouth daily.  HYPERLIPIDEMIA - CBC with Differential - Lipid Panel  GOUT  Other Orders - amLODipine (NORVASC) 5 MG tablet; TAKE ONE TABLET BY MOUTH ONCE DAILY - fosinopril (MONOPRIL) 40 MG tablet; TAKE ONE TABLET BY MOUTH ONCE DAILY - simvastatin (ZOCOR) 10 MG tablet; TAKE  1 TABLET BY MOUTH EVERY DAY   Health Maintenance UTD.  Due to bradycardia, will reduce atenolol by half. Follow up in 1 month to reassess.  Pt EKG with possible old anteroseptal infarct, however to to Left Axis and similar appearing readings on EKGs over the last 10 years, and pt being completely asymptomatic, will use watchful waiting for now and monitor annually.  Return precautions provided, and patient handout on heart healthy diet, health maintenance.  Plan to follow up in about 1 month to reassess BP, or for worsening or persistent symptoms despite treatment.  Patient Instructions  We will call you with your lab results when available.  We are halving the dose of your atenolol due to your slow heart rate. We will follow up in about 1 month to recheck your BP with this change.  Continue Current medications as directed.  Follow up as needed for any additional concerns.

## 2014-03-08 DIAGNOSIS — Z1331 Encounter for screening for depression: Secondary | ICD-10-CM | POA: Diagnosis not present

## 2014-03-08 DIAGNOSIS — E785 Hyperlipidemia, unspecified: Secondary | ICD-10-CM | POA: Diagnosis not present

## 2014-03-08 DIAGNOSIS — N400 Enlarged prostate without lower urinary tract symptoms: Secondary | ICD-10-CM | POA: Diagnosis not present

## 2014-03-08 DIAGNOSIS — I10 Essential (primary) hypertension: Secondary | ICD-10-CM | POA: Diagnosis not present

## 2014-03-08 DIAGNOSIS — I498 Other specified cardiac arrhythmias: Secondary | ICD-10-CM | POA: Diagnosis not present

## 2014-03-08 DIAGNOSIS — M109 Gout, unspecified: Secondary | ICD-10-CM | POA: Diagnosis not present

## 2014-03-08 DIAGNOSIS — M1990 Unspecified osteoarthritis, unspecified site: Secondary | ICD-10-CM | POA: Diagnosis not present

## 2014-03-08 DIAGNOSIS — H0289 Other specified disorders of eyelid: Secondary | ICD-10-CM | POA: Diagnosis not present

## 2014-04-05 DIAGNOSIS — I10 Essential (primary) hypertension: Secondary | ICD-10-CM | POA: Diagnosis not present

## 2014-04-11 DIAGNOSIS — I10 Essential (primary) hypertension: Secondary | ICD-10-CM | POA: Diagnosis not present

## 2014-07-14 HISTORY — PX: UMBILICAL HERNIA REPAIR: SHX196

## 2015-01-08 ENCOUNTER — Other Ambulatory Visit: Payer: Self-pay

## 2015-03-22 DIAGNOSIS — I10 Essential (primary) hypertension: Secondary | ICD-10-CM | POA: Diagnosis not present

## 2015-03-22 DIAGNOSIS — M109 Gout, unspecified: Secondary | ICD-10-CM | POA: Diagnosis not present

## 2015-03-22 DIAGNOSIS — E785 Hyperlipidemia, unspecified: Secondary | ICD-10-CM | POA: Diagnosis not present

## 2015-03-22 DIAGNOSIS — Z125 Encounter for screening for malignant neoplasm of prostate: Secondary | ICD-10-CM | POA: Diagnosis not present

## 2015-03-26 DIAGNOSIS — Z1212 Encounter for screening for malignant neoplasm of rectum: Secondary | ICD-10-CM | POA: Diagnosis not present

## 2015-03-29 DIAGNOSIS — M1990 Unspecified osteoarthritis, unspecified site: Secondary | ICD-10-CM | POA: Diagnosis not present

## 2015-03-29 DIAGNOSIS — R001 Bradycardia, unspecified: Secondary | ICD-10-CM | POA: Diagnosis not present

## 2015-03-29 DIAGNOSIS — Z0000 Encounter for general adult medical examination without abnormal findings: Secondary | ICD-10-CM | POA: Diagnosis not present

## 2015-03-29 DIAGNOSIS — H02409 Unspecified ptosis of unspecified eyelid: Secondary | ICD-10-CM | POA: Diagnosis not present

## 2015-03-29 DIAGNOSIS — M109 Gout, unspecified: Secondary | ICD-10-CM | POA: Diagnosis not present

## 2015-03-29 DIAGNOSIS — R011 Cardiac murmur, unspecified: Secondary | ICD-10-CM | POA: Diagnosis not present

## 2015-03-29 DIAGNOSIS — E785 Hyperlipidemia, unspecified: Secondary | ICD-10-CM | POA: Diagnosis not present

## 2015-03-29 DIAGNOSIS — Z23 Encounter for immunization: Secondary | ICD-10-CM | POA: Diagnosis not present

## 2015-03-29 DIAGNOSIS — Z6831 Body mass index (BMI) 31.0-31.9, adult: Secondary | ICD-10-CM | POA: Diagnosis not present

## 2015-03-29 DIAGNOSIS — Z1389 Encounter for screening for other disorder: Secondary | ICD-10-CM | POA: Diagnosis not present

## 2015-03-29 DIAGNOSIS — N400 Enlarged prostate without lower urinary tract symptoms: Secondary | ICD-10-CM | POA: Diagnosis not present

## 2015-03-29 DIAGNOSIS — K429 Umbilical hernia without obstruction or gangrene: Secondary | ICD-10-CM | POA: Diagnosis not present

## 2015-04-11 DIAGNOSIS — R011 Cardiac murmur, unspecified: Secondary | ICD-10-CM | POA: Diagnosis not present

## 2015-04-18 DIAGNOSIS — M109 Gout, unspecified: Secondary | ICD-10-CM | POA: Diagnosis not present

## 2015-04-18 DIAGNOSIS — Z9049 Acquired absence of other specified parts of digestive tract: Secondary | ICD-10-CM | POA: Diagnosis not present

## 2015-04-18 DIAGNOSIS — I10 Essential (primary) hypertension: Secondary | ICD-10-CM | POA: Diagnosis not present

## 2015-04-18 DIAGNOSIS — E785 Hyperlipidemia, unspecified: Secondary | ICD-10-CM | POA: Diagnosis not present

## 2015-04-18 DIAGNOSIS — K429 Umbilical hernia without obstruction or gangrene: Secondary | ICD-10-CM | POA: Diagnosis not present

## 2015-04-18 DIAGNOSIS — N400 Enlarged prostate without lower urinary tract symptoms: Secondary | ICD-10-CM | POA: Diagnosis not present

## 2015-04-18 DIAGNOSIS — Z6830 Body mass index (BMI) 30.0-30.9, adult: Secondary | ICD-10-CM | POA: Diagnosis not present

## 2015-05-11 DIAGNOSIS — K429 Umbilical hernia without obstruction or gangrene: Secondary | ICD-10-CM | POA: Diagnosis not present

## 2015-05-21 DIAGNOSIS — H02831 Dermatochalasis of right upper eyelid: Secondary | ICD-10-CM | POA: Diagnosis not present

## 2015-05-21 DIAGNOSIS — H43813 Vitreous degeneration, bilateral: Secondary | ICD-10-CM | POA: Diagnosis not present

## 2015-05-21 DIAGNOSIS — H02402 Unspecified ptosis of left eyelid: Secondary | ICD-10-CM | POA: Diagnosis not present

## 2015-05-21 DIAGNOSIS — H2513 Age-related nuclear cataract, bilateral: Secondary | ICD-10-CM | POA: Diagnosis not present

## 2016-03-24 DIAGNOSIS — I10 Essential (primary) hypertension: Secondary | ICD-10-CM | POA: Diagnosis not present

## 2016-03-24 DIAGNOSIS — E784 Other hyperlipidemia: Secondary | ICD-10-CM | POA: Diagnosis not present

## 2016-03-31 DIAGNOSIS — I350 Nonrheumatic aortic (valve) stenosis: Secondary | ICD-10-CM | POA: Diagnosis not present

## 2016-03-31 DIAGNOSIS — K429 Umbilical hernia without obstruction or gangrene: Secondary | ICD-10-CM | POA: Diagnosis not present

## 2016-03-31 DIAGNOSIS — Z1389 Encounter for screening for other disorder: Secondary | ICD-10-CM | POA: Diagnosis not present

## 2016-03-31 DIAGNOSIS — M109 Gout, unspecified: Secondary | ICD-10-CM | POA: Diagnosis not present

## 2016-03-31 DIAGNOSIS — H02409 Unspecified ptosis of unspecified eyelid: Secondary | ICD-10-CM | POA: Diagnosis not present

## 2016-03-31 DIAGNOSIS — E784 Other hyperlipidemia: Secondary | ICD-10-CM | POA: Diagnosis not present

## 2016-03-31 DIAGNOSIS — Z0000 Encounter for general adult medical examination without abnormal findings: Secondary | ICD-10-CM | POA: Diagnosis not present

## 2016-03-31 DIAGNOSIS — R001 Bradycardia, unspecified: Secondary | ICD-10-CM | POA: Diagnosis not present

## 2016-03-31 DIAGNOSIS — M1990 Unspecified osteoarthritis, unspecified site: Secondary | ICD-10-CM | POA: Diagnosis not present

## 2016-03-31 DIAGNOSIS — Z23 Encounter for immunization: Secondary | ICD-10-CM | POA: Diagnosis not present

## 2016-03-31 DIAGNOSIS — I10 Essential (primary) hypertension: Secondary | ICD-10-CM | POA: Diagnosis not present

## 2016-03-31 DIAGNOSIS — N400 Enlarged prostate without lower urinary tract symptoms: Secondary | ICD-10-CM | POA: Diagnosis not present

## 2016-04-03 DIAGNOSIS — Z1212 Encounter for screening for malignant neoplasm of rectum: Secondary | ICD-10-CM | POA: Diagnosis not present

## 2017-04-22 DIAGNOSIS — M109 Gout, unspecified: Secondary | ICD-10-CM | POA: Diagnosis not present

## 2017-04-22 DIAGNOSIS — I10 Essential (primary) hypertension: Secondary | ICD-10-CM | POA: Diagnosis not present

## 2017-04-22 DIAGNOSIS — Z125 Encounter for screening for malignant neoplasm of prostate: Secondary | ICD-10-CM | POA: Diagnosis not present

## 2017-04-22 DIAGNOSIS — E7849 Other hyperlipidemia: Secondary | ICD-10-CM | POA: Diagnosis not present

## 2017-04-22 DIAGNOSIS — Z0000 Encounter for general adult medical examination without abnormal findings: Secondary | ICD-10-CM | POA: Diagnosis not present

## 2017-04-27 DIAGNOSIS — I350 Nonrheumatic aortic (valve) stenosis: Secondary | ICD-10-CM | POA: Diagnosis not present

## 2017-04-27 DIAGNOSIS — Z0000 Encounter for general adult medical examination without abnormal findings: Secondary | ICD-10-CM | POA: Diagnosis not present

## 2017-04-27 DIAGNOSIS — Z23 Encounter for immunization: Secondary | ICD-10-CM | POA: Diagnosis not present

## 2017-04-27 DIAGNOSIS — R001 Bradycardia, unspecified: Secondary | ICD-10-CM | POA: Diagnosis not present

## 2017-04-27 DIAGNOSIS — M1990 Unspecified osteoarthritis, unspecified site: Secondary | ICD-10-CM | POA: Diagnosis not present

## 2017-04-27 DIAGNOSIS — M109 Gout, unspecified: Secondary | ICD-10-CM | POA: Diagnosis not present

## 2017-04-27 DIAGNOSIS — K429 Umbilical hernia without obstruction or gangrene: Secondary | ICD-10-CM | POA: Diagnosis not present

## 2017-04-27 DIAGNOSIS — Z1389 Encounter for screening for other disorder: Secondary | ICD-10-CM | POA: Diagnosis not present

## 2017-04-27 DIAGNOSIS — N400 Enlarged prostate without lower urinary tract symptoms: Secondary | ICD-10-CM | POA: Diagnosis not present

## 2017-04-27 DIAGNOSIS — I10 Essential (primary) hypertension: Secondary | ICD-10-CM | POA: Diagnosis not present

## 2017-04-27 DIAGNOSIS — E7849 Other hyperlipidemia: Secondary | ICD-10-CM | POA: Diagnosis not present

## 2017-04-27 DIAGNOSIS — Z6830 Body mass index (BMI) 30.0-30.9, adult: Secondary | ICD-10-CM | POA: Diagnosis not present

## 2017-04-28 DIAGNOSIS — Z1212 Encounter for screening for malignant neoplasm of rectum: Secondary | ICD-10-CM | POA: Diagnosis not present

## 2017-05-27 DIAGNOSIS — I10 Essential (primary) hypertension: Secondary | ICD-10-CM | POA: Diagnosis not present

## 2017-05-27 DIAGNOSIS — R001 Bradycardia, unspecified: Secondary | ICD-10-CM | POA: Diagnosis not present

## 2017-05-27 DIAGNOSIS — I350 Nonrheumatic aortic (valve) stenosis: Secondary | ICD-10-CM | POA: Diagnosis not present

## 2017-07-14 DIAGNOSIS — R413 Other amnesia: Secondary | ICD-10-CM

## 2017-07-14 HISTORY — DX: Other amnesia: R41.3

## 2017-09-17 DIAGNOSIS — D225 Melanocytic nevi of trunk: Secondary | ICD-10-CM | POA: Diagnosis not present

## 2017-09-17 DIAGNOSIS — C44519 Basal cell carcinoma of skin of other part of trunk: Secondary | ICD-10-CM | POA: Diagnosis not present

## 2017-09-17 DIAGNOSIS — D485 Neoplasm of uncertain behavior of skin: Secondary | ICD-10-CM | POA: Diagnosis not present

## 2017-09-17 DIAGNOSIS — L918 Other hypertrophic disorders of the skin: Secondary | ICD-10-CM | POA: Diagnosis not present

## 2017-09-17 DIAGNOSIS — L820 Inflamed seborrheic keratosis: Secondary | ICD-10-CM | POA: Diagnosis not present

## 2017-09-17 DIAGNOSIS — L578 Other skin changes due to chronic exposure to nonionizing radiation: Secondary | ICD-10-CM | POA: Diagnosis not present

## 2017-09-17 DIAGNOSIS — D2261 Melanocytic nevi of right upper limb, including shoulder: Secondary | ICD-10-CM | POA: Diagnosis not present

## 2017-09-17 DIAGNOSIS — L570 Actinic keratosis: Secondary | ICD-10-CM | POA: Diagnosis not present

## 2017-09-17 DIAGNOSIS — D2262 Melanocytic nevi of left upper limb, including shoulder: Secondary | ICD-10-CM | POA: Diagnosis not present

## 2017-09-17 DIAGNOSIS — C44612 Basal cell carcinoma of skin of right upper limb, including shoulder: Secondary | ICD-10-CM | POA: Diagnosis not present

## 2017-09-17 DIAGNOSIS — D224 Melanocytic nevi of scalp and neck: Secondary | ICD-10-CM | POA: Diagnosis not present

## 2017-09-24 DIAGNOSIS — C44612 Basal cell carcinoma of skin of right upper limb, including shoulder: Secondary | ICD-10-CM | POA: Diagnosis not present

## 2018-04-17 DIAGNOSIS — Z23 Encounter for immunization: Secondary | ICD-10-CM | POA: Diagnosis not present

## 2018-05-12 DIAGNOSIS — M109 Gout, unspecified: Secondary | ICD-10-CM | POA: Diagnosis not present

## 2018-05-12 DIAGNOSIS — R82998 Other abnormal findings in urine: Secondary | ICD-10-CM | POA: Diagnosis not present

## 2018-05-12 DIAGNOSIS — E7849 Other hyperlipidemia: Secondary | ICD-10-CM | POA: Diagnosis not present

## 2018-05-12 DIAGNOSIS — I10 Essential (primary) hypertension: Secondary | ICD-10-CM | POA: Diagnosis not present

## 2018-05-12 DIAGNOSIS — Z0000 Encounter for general adult medical examination without abnormal findings: Secondary | ICD-10-CM | POA: Diagnosis not present

## 2018-05-12 DIAGNOSIS — Z125 Encounter for screening for malignant neoplasm of prostate: Secondary | ICD-10-CM | POA: Diagnosis not present

## 2018-05-18 DIAGNOSIS — Z1212 Encounter for screening for malignant neoplasm of rectum: Secondary | ICD-10-CM | POA: Diagnosis not present

## 2018-05-19 DIAGNOSIS — I10 Essential (primary) hypertension: Secondary | ICD-10-CM | POA: Diagnosis not present

## 2018-05-19 DIAGNOSIS — Z1389 Encounter for screening for other disorder: Secondary | ICD-10-CM | POA: Diagnosis not present

## 2018-05-19 DIAGNOSIS — R413 Other amnesia: Secondary | ICD-10-CM | POA: Diagnosis not present

## 2018-05-19 DIAGNOSIS — Z6828 Body mass index (BMI) 28.0-28.9, adult: Secondary | ICD-10-CM | POA: Diagnosis not present

## 2018-05-19 DIAGNOSIS — E7849 Other hyperlipidemia: Secondary | ICD-10-CM | POA: Diagnosis not present

## 2018-05-19 DIAGNOSIS — M1990 Unspecified osteoarthritis, unspecified site: Secondary | ICD-10-CM | POA: Diagnosis not present

## 2018-05-19 DIAGNOSIS — Z0000 Encounter for general adult medical examination without abnormal findings: Secondary | ICD-10-CM | POA: Diagnosis not present

## 2018-05-19 DIAGNOSIS — N400 Enlarged prostate without lower urinary tract symptoms: Secondary | ICD-10-CM | POA: Diagnosis not present

## 2018-05-19 DIAGNOSIS — I350 Nonrheumatic aortic (valve) stenosis: Secondary | ICD-10-CM | POA: Diagnosis not present

## 2018-05-19 DIAGNOSIS — Z23 Encounter for immunization: Secondary | ICD-10-CM | POA: Diagnosis not present

## 2018-05-19 DIAGNOSIS — M109 Gout, unspecified: Secondary | ICD-10-CM | POA: Diagnosis not present

## 2018-05-20 ENCOUNTER — Other Ambulatory Visit (HOSPITAL_COMMUNITY): Payer: Self-pay | Admitting: Internal Medicine

## 2018-05-20 DIAGNOSIS — I350 Nonrheumatic aortic (valve) stenosis: Secondary | ICD-10-CM

## 2018-05-28 ENCOUNTER — Ambulatory Visit (HOSPITAL_COMMUNITY): Payer: Medicare Other | Attending: Cardiovascular Disease

## 2018-05-28 ENCOUNTER — Other Ambulatory Visit: Payer: Self-pay

## 2018-05-28 DIAGNOSIS — I350 Nonrheumatic aortic (valve) stenosis: Secondary | ICD-10-CM

## 2018-09-27 DIAGNOSIS — D485 Neoplasm of uncertain behavior of skin: Secondary | ICD-10-CM | POA: Diagnosis not present

## 2018-09-27 DIAGNOSIS — D2262 Melanocytic nevi of left upper limb, including shoulder: Secondary | ICD-10-CM | POA: Diagnosis not present

## 2018-09-27 DIAGNOSIS — D2372 Other benign neoplasm of skin of left lower limb, including hip: Secondary | ICD-10-CM | POA: Diagnosis not present

## 2018-09-27 DIAGNOSIS — D2261 Melanocytic nevi of right upper limb, including shoulder: Secondary | ICD-10-CM | POA: Diagnosis not present

## 2018-09-27 DIAGNOSIS — L570 Actinic keratosis: Secondary | ICD-10-CM | POA: Diagnosis not present

## 2018-09-27 DIAGNOSIS — D225 Melanocytic nevi of trunk: Secondary | ICD-10-CM | POA: Diagnosis not present

## 2018-09-27 DIAGNOSIS — Z85828 Personal history of other malignant neoplasm of skin: Secondary | ICD-10-CM | POA: Diagnosis not present

## 2018-11-30 DIAGNOSIS — H52203 Unspecified astigmatism, bilateral: Secondary | ICD-10-CM | POA: Diagnosis not present

## 2018-11-30 DIAGNOSIS — H524 Presbyopia: Secondary | ICD-10-CM | POA: Diagnosis not present

## 2018-11-30 DIAGNOSIS — H43813 Vitreous degeneration, bilateral: Secondary | ICD-10-CM | POA: Diagnosis not present

## 2018-11-30 DIAGNOSIS — H2513 Age-related nuclear cataract, bilateral: Secondary | ICD-10-CM | POA: Diagnosis not present

## 2018-12-14 DIAGNOSIS — Z20828 Contact with and (suspected) exposure to other viral communicable diseases: Secondary | ICD-10-CM | POA: Diagnosis not present

## 2019-03-24 DIAGNOSIS — Z23 Encounter for immunization: Secondary | ICD-10-CM | POA: Diagnosis not present

## 2019-05-27 DIAGNOSIS — M109 Gout, unspecified: Secondary | ICD-10-CM | POA: Diagnosis not present

## 2019-05-27 DIAGNOSIS — E7849 Other hyperlipidemia: Secondary | ICD-10-CM | POA: Diagnosis not present

## 2019-05-27 DIAGNOSIS — Z125 Encounter for screening for malignant neoplasm of prostate: Secondary | ICD-10-CM | POA: Diagnosis not present

## 2019-06-15 DIAGNOSIS — R82998 Other abnormal findings in urine: Secondary | ICD-10-CM | POA: Diagnosis not present

## 2019-08-10 ENCOUNTER — Ambulatory Visit: Payer: Medicare Other

## 2019-08-19 ENCOUNTER — Ambulatory Visit: Payer: Medicare Other | Attending: Internal Medicine

## 2019-08-19 DIAGNOSIS — Z23 Encounter for immunization: Secondary | ICD-10-CM | POA: Insufficient documentation

## 2019-08-19 NOTE — Progress Notes (Signed)
   Covid-19 Vaccination Clinic  Name:  RAYMEL VANDONGEN    MRN: [id number] DOB: February 13, 1946  08/19/2019  Mr. Barrett was observed post Covid-19 immunization for 15 minutes without incidence. He was provided with Vaccine Information Sheet and instruction to access the V-Safe system.   Mr. Reveal was instructed to call 911 with any severe reactions post vaccine: Marland Kitchen Difficulty breathing  . Swelling of your face and throat  . A fast heartbeat  . A bad rash all over your body  . Dizziness and weakness    Immunizations Administered    Name Date Dose VIS Date Route   Pfizer COVID-19 Vaccine 08/19/2019 10:16 AM 0.3 mL 06/24/2019 Intramuscular   Manufacturer: [place]   Lot: [id number]   [place]: [id number]

## 2019-08-23 ENCOUNTER — Telehealth: Payer: Self-pay | Admitting: *Deleted

## 2019-08-23 ENCOUNTER — Encounter: Payer: Self-pay | Admitting: Neurology

## 2019-08-23 ENCOUNTER — Other Ambulatory Visit: Payer: Self-pay

## 2019-08-23 ENCOUNTER — Ambulatory Visit (INDEPENDENT_AMBULATORY_CARE_PROVIDER_SITE_OTHER): Payer: Medicare Other | Admitting: Neurology

## 2019-08-23 DIAGNOSIS — G20 Parkinson's disease: Secondary | ICD-10-CM

## 2019-08-23 DIAGNOSIS — Z820 Family history of epilepsy and other diseases of the nervous system: Secondary | ICD-10-CM | POA: Diagnosis not present

## 2019-08-23 DIAGNOSIS — R413 Other amnesia: Secondary | ICD-10-CM | POA: Diagnosis not present

## 2019-08-23 DIAGNOSIS — R251 Tremor, unspecified: Secondary | ICD-10-CM | POA: Diagnosis not present

## 2019-08-23 NOTE — Patient Instructions (Signed)
MRI brain Blood work Formal Memory Testing: Dr. Sima Matas DAT scan

## 2019-08-23 NOTE — Telephone Encounter (Signed)
Patient signed DAT scan consent while in office today.

## 2019-08-23 NOTE — Progress Notes (Signed)
[facility]    Provider:  Dr Jaynee Eagles Requesting Provider: Prince Solian, MD Primary Care Provider:  Prince Solian, MD  CC:  Memory loss  HPI:  Albert Pierce is a 74 y.o. male here as requested by Prince Solian, MD for memory loss. PMHx short-term memory loss, HTN, HLD, Arthritis. Patient here with his wife, he says he is not as sharp as he once was. He forgets things sometimes, he says it has been worse over the past few months. His wife provides most information. It has been at least 18 months, when he turned 70 patient's wife noticed. Patient uses his GPS for new places, also places he has been before just to make sure like if he [place] he may use the GPS even though he knows how to get there, he says this is just for safety. Wife says he doesn't remember going someplace for the weekend a year ago, she has noticed a pattern. He will repeat the same statement multiple times in the same day. Daughters have noticed this too. 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 Wife of 5 years. He may have a slight shake in one hand. His mother, maternal aunt and maternal uncle had Parkinson's disease, his older sister is not affected and does not have memory loss. Wife is very frightened there is dementia starting. He needs his wife to help with finances now. He can perform all his ADLs but is need more help with IADLs such as managing money. Tremor is more with action vs resting tremor but she is worried about it, family has noticed it. His voice is getting softer, handwriting hard to read, his voice is softer. No drooling. No falls. Smell and taste is impaired. He drinks 1-2 glasses of wine a day of a lrage pour, they usually finish a bottle red and white.   Review of Systems: Patient complains of symptoms per HPI as well as the  following symptoms: memory loss. Pertinent negatives and positives per HPI. All others negative.   Social History   Socioeconomic History  . Marital status: Married    Spouse name: Not on file  . Number of children: 3  . Years of education: 67  . Highest education level: Professional school degree (e.g., MD, DDS, DVM, JD)  Occupational History  . Not on file  Tobacco Use  . Smoking status: Never Smoker  . Smokeless tobacco: Never Used  Substance and Sexual Activity  . Alcohol use: Yes    Alcohol/week: 2.0 - 8.0 standard drinks    Types: 2 - 8 Standard drinks or equivalent per week    Comment: generally wine, occ beer or hard liquor   . Drug use: No  . Sexual activity: Not on file  Other Topics Concern  . Not on file  Social History Narrative   Lives at home with wife   Right handed   Caffeine: diet coke, 2-4 cups/day   Social Determinants of Health   Financial Resource Strain:   . Difficulty of Paying Living Expenses: Not on file  Food Insecurity:   . Worried About Charity fundraiser in the Last Year: Not on file  . Ran Out of Food in the Last Year: Not on file  Transportation Needs:   . Lack of Transportation (Medical): Not on file  .  Lack of Transportation (Non-Medical): Not on file  Physical Activity:   . Days of Exercise per Week: Not on file  . Minutes of Exercise per Session: Not on file  Stress:   . Feeling of Stress : Not on file  Social Connections:   . Frequency of Communication with Friends and Family: Not on file  . Frequency of Social Gatherings with Friends and Family: Not on file  . Attends Religious Services: Not on file  . Active Member of Clubs or Organizations: Not on file  . Attends Archivist Meetings: Not on file  . Marital Status: Not on file  Intimate Partner Violence:   . Fear of Current or Ex-Partner: Not on file  . Emotionally Abused: Not on file  . Physically Abused: Not on file  . Sexually Abused: Not on file    Family  History  Problem Relation Age of Onset  . Parkinson's disease Mother   . Tuberculosis Maternal Grandfather   . Tuberculosis Paternal Grandfather   . Parkinson's disease Maternal Aunt   . Parkinson's disease Maternal Uncle   . Dementia Neg Hx   . Alzheimer's disease Neg Hx     Past Medical History:  Diagnosis Date  . Appendicitis   . Arthritis   . Gout   . Hyperlipidemia   . Hypertension   . Short-term memory loss 2019   mild    Patient Active Problem List   Diagnosis Date Noted  . GOUT 12/31/2007  . HYPERLIPIDEMIA 03/25/2007  . HYPERTENSION 03/25/2007    Past Surgical History:  Procedure Laterality Date  . APPENDECTOMY     74 y.o.  . COLONOSCOPY    . EYE MUSCLE SURGERY    . UMBILICAL HERNIA REPAIR  2016   Dr Dalbert Batman    Current Outpatient Medications  Medication Sig Dispense Refill  . amLODipine (NORVASC) 5 MG tablet TAKE ONE TABLET BY MOUTH ONCE DAILY 90 tablet 3  . aspirin 81 MG tablet Take 81 mg by mouth daily.      Marland Kitchen atenolol (TENORMIN) 25 MG tablet Take 1 tablet (25 mg total) by mouth daily. 90 tablet 1  . colchicine 0.6 MG tablet Take 1 tablet (0.6 mg total) by mouth 3 (three) times daily as needed. 20 tablet 1  . fish oil-omega-3 fatty acids 1000 MG capsule Take 3 g by mouth daily.     . fosinopril (MONOPRIL) 40 MG tablet TAKE ONE TABLET BY MOUTH ONCE DAILY 90 tablet 3  . rosuvastatin (CRESTOR) 10 MG tablet Take 10 mg by mouth daily.     No current facility-administered medications for this visit.    Allergies as of 08/23/2019  . (No Known Allergies)    Vitals: BP (!) 147/80 (BP Location: Right Arm, Patient Position: Sitting)   Pulse (!) 47   Temp (!) 97.3 F (36.3 C)   Ht 5\' 10"  (1.778 m)   Wt 185 lb (83.9 kg)   BMI 26.54 kg/m  Last Weight:  Wt Readings from Last 1 Encounters:  08/23/19 185 lb (83.9 kg)   Last Height:   Ht Readings from Last 1 Encounters:  08/23/19 5\' 10"  (1.778 m)     Physical exam: Exam: Gen: NAD, conversant, well  nourised,  well groomed                     CV: RRR, no MRG. No Carotid Bruits. No peripheral edema, warm, nontender Eyes: Conjunctivae clear without exudates or hemorrhage  Neuro: Detailed  Neurologic Exam  Speech:    Speech is normal; fluent and spontaneous with normal comprehension.  Cognition:  MMSE - Mini Mental State Exam 08/23/2019  Orientation to time 5  Orientation to Place 5  Registration 3  Attention/ Calculation 5  Recall 3  Language- name 2 objects 2  Language- repeat 1  Language- follow 3 step command 3  Language- read & follow direction 1  Write a sentence 0  Copy design 1  Total score 29   Cranial Nerves: Hypomimia.     The pupils are equal, round, and reactive to light. Could not visualize fundi.. Visual fields are full to finger confrontation. Impaired upgaze. Trigeminal sensation is intact and the muscles of mastication are normal. Left ptosis(chronic). The palate elevates in the midline. Hearing intact. Voice is normal. Shoulder shrug is normal. The tongue has normal motion without fasciculations.   Coordination:    Normal finger to nose and heel to shin.   Gait:    Heel-toe and tandem gait are normal.   Motor Observation:    No asymmetry, no atrophy, and no involuntary movements noted. Tone:    Increased tone left > right  Posture:    Slightly flexed at the waist    Strength:    Strength is V/V in the upper and lower limbs.      Sensation: intact to LT     Reflex Exam:  DTR's:    Absent AJs otherwise 1+. Deep tendon reflexes in the upper and lower extremities are normal bilaterally.   Toes:    The toes are downgoing bilaterally.   Clonus:    Clonus is absent.    Assessment/Plan:   74 y.o. male here as requested by Prince Solian, MD for memory loss. PMHx short-term memory loss, HTN, HLD, Arthritis. He is parkinsonian on exam(hypomimia, cogwheeling, decreased arm swing and slight flexion at the waist), also reports loss of voice volume,  illegible handwriting, action tremor,  and he has extensive history of parkinson's disease on his mother's side.   - MRI of the brain w/wo contrast for reversible causes of dementia - Blood work today - Formal memory testing - Dr. Sima Matas - Need DAT scan to differentiate between essential tremor vs Parkinson's disease spectrum or possibly Lewy Body Dementia  - Discussed alcohol use, 1-2 max a day, he likely has more than that daily and this can cause dementia over long periods.  Orders Placed This Encounter  Procedures  . MR BRAIN W WO CONTRAST  . NM BRAIN DATSCAN TUMOR LOC INFLAM SPECT 1 DAY  . B12 and Folate Panel  . Methylmalonic acid, serum  . Vitamin B1  . Homocysteine  . RPR  . Basic Metabolic Panel  . Ambulatory referral to Neuropsychology   No orders of the defined types were placed in this encounter.   Cc: Avva, Ravisankar, MD   A total of 60 minutes was spent on this patient's care, reviewing imaging, past records, recent hospitalization notes and results. Over half this time was spent on counseling patient on the  1. Parkinsonism, unspecified Parkinsonism type ([place])   2. Tremor   3. Family history of Parkinson disease   4. Memory loss    diagnosis and different diagnostic and therapeutic options, counseling and coordination of care, risks and benefitsof management, compliance, or risk factor reduction and education.     Sarina Ill, MD  [facility] [address] [place], [place] [address]  Phone [phone] Fax [phone]

## 2019-08-25 ENCOUNTER — Telehealth: Payer: Self-pay | Admitting: Neurology

## 2019-08-25 NOTE — Telephone Encounter (Signed)
[place]ic[place] of [place] order sent to [facility]. No auth they will reach out to the patient to schedule.  

## 2019-08-29 LAB — BASIC METABOLIC PANEL
BUN/Creatinine Ratio: 17 (ref 10–24)
BUN: 18 mg/dL (ref 8–27)
CO2: 24 mmol/L (ref 20–29)
Calcium: 9.3 mg/dL (ref 8.6–10.2)
Chloride: 108 mmol/L — ABNORMAL HIGH (ref 96–106)
Creatinine, Ser: 1.09 mg/dL (ref 0.76–1.27)
GFR calc Af Amer: 78 mL/min/{1.73_m2} (ref >59)
GFR calc non Af Amer: 67 mL/min/{1.73_m2} (ref >59)
Glucose: 78 mg/dL (ref 65–99)
Potassium: 4.4 mmol/L (ref 3.5–5.2)
Sodium: 144 mmol/L (ref 134–144)

## 2019-08-29 LAB — METHYLMALONIC ACID, SERUM: Methylmalonic Acid: 271 nmol/L (ref 0–378)

## 2019-08-29 LAB — RPR: RPR Ser Ql: NONREACTIVE

## 2019-08-29 LAB — B12 AND FOLATE PANEL
Folate: 8.8 ng/mL (ref >3.0)
Vitamin B-12: 283 pg/mL (ref 232–1245)

## 2019-08-29 LAB — HOMOCYSTEINE: Homocysteine: 10.4 umol/L (ref 0.0–19.2)

## 2019-08-29 LAB — VITAMIN B1: Thiamine: 163.3 nmol/L (ref 66.5–200.0)

## 2019-08-29 NOTE — Progress Notes (Signed)
Labs look fine, his B12 is low normal but still appears to be in the normal range with more sensitive labs normal too (MMA and homocysteine normal) so is fine. Thanks, dr Jaynee Eagles

## 2019-08-31 ENCOUNTER — Ambulatory Visit: Payer: Medicare Other

## 2019-08-31 ENCOUNTER — Encounter: Payer: Self-pay | Admitting: Psychology

## 2019-09-06 NOTE — Telephone Encounter (Signed)
DAT scan is processing . I will call patient once he is approved . Thanks Hinton Dyer

## 2019-09-13 ENCOUNTER — Ambulatory Visit: Payer: Medicare Other | Attending: Internal Medicine

## 2019-09-13 DIAGNOSIS — Z23 Encounter for immunization: Secondary | ICD-10-CM | POA: Insufficient documentation

## 2019-09-13 NOTE — Progress Notes (Signed)
   Covid-19 Vaccination Clinic  Name:  Albert Pierce    MRN: [id number] DOB: [birth year]  09/13/2019  [name] was observed post Covid-19 immunization for 15 minutes without incident. He was provided with Vaccine Information Sheet and instruction to access the V-Safe system.   [name] was instructed to call 911 with any severe reactions post vaccine: Marland Kitchen Difficulty breathing  . Swelling of face and throat  . A fast heartbeat  . A bad rash all over body  . Dizziness and weakness   Immunizations Administered    Name Date Dose VIS Date Route   Pfizer COVID-19 Vaccine 09/13/2019 11:07 AM 0.3 mL 06/24/2019 Intramuscular   Manufacturer: [place]   Lot: [id number]   [place]: [id number]

## 2019-09-14 ENCOUNTER — Other Ambulatory Visit: Payer: Medicare Other

## 2019-09-27 DIAGNOSIS — D485 Neoplasm of uncertain behavior of skin: Secondary | ICD-10-CM | POA: Diagnosis not present

## 2019-09-27 DIAGNOSIS — D224 Melanocytic nevi of scalp and neck: Secondary | ICD-10-CM | POA: Diagnosis not present

## 2019-09-27 DIAGNOSIS — D2261 Melanocytic nevi of right upper limb, including shoulder: Secondary | ICD-10-CM | POA: Diagnosis not present

## 2019-09-27 DIAGNOSIS — D2272 Melanocytic nevi of left lower limb, including hip: Secondary | ICD-10-CM | POA: Diagnosis not present

## 2019-09-27 DIAGNOSIS — L570 Actinic keratosis: Secondary | ICD-10-CM | POA: Diagnosis not present

## 2019-09-27 DIAGNOSIS — D2262 Melanocytic nevi of left upper limb, including shoulder: Secondary | ICD-10-CM | POA: Diagnosis not present

## 2019-09-27 DIAGNOSIS — D225 Melanocytic nevi of trunk: Secondary | ICD-10-CM | POA: Diagnosis not present

## 2019-09-27 DIAGNOSIS — C4359 Malignant melanoma of other part of trunk: Secondary | ICD-10-CM | POA: Diagnosis not present

## 2019-09-27 DIAGNOSIS — Z85828 Personal history of other malignant neoplasm of skin: Secondary | ICD-10-CM | POA: Diagnosis not present

## 2019-10-03 ENCOUNTER — Ambulatory Visit
Admission: RE | Admit: 2019-10-03 | Discharge: 2019-10-03 | Disposition: A | Discharge status: Home / Self Care | Payer: Medicare Other | Source: Ambulatory Visit | Attending: Neurology | Admitting: Neurology

## 2019-10-03 DIAGNOSIS — R413 Other amnesia: Secondary | ICD-10-CM

## 2019-10-03 DIAGNOSIS — G20 Parkinson's disease: Secondary | ICD-10-CM

## 2019-10-03 DIAGNOSIS — R251 Tremor, unspecified: Secondary | ICD-10-CM

## 2019-10-03 MED ORDER — GADOBENATE DIMEGLUMINE 529 MG/ML IV SOLN
15.0000 mL | Freq: Once | INTRAVENOUS | Status: AC | PRN
  Administered 2019-10-03: 15 mL via INTRAVENOUS

## 2019-10-04 ENCOUNTER — Telehealth: Payer: Self-pay | Admitting: Neurology

## 2019-10-04 NOTE — Telephone Encounter (Signed)
Spoke with pt and discussed Dr. Cathren Laine message below. Pt verbalized understanding. He will call Dr. Ferne Coe office to inquire about scheduling the appt to review results with him and pt will call our office to r/s the appt with Dr. Jaynee Eagles.

## 2019-10-04 NOTE — Telephone Encounter (Signed)
Patient has an appointment for memory testing with Dr. Sima Matas on June 1st. I think we need to push back my appointment with them because they will unlikely be done by June 14th, Dr. Sima Matas usually schedules and appointment to review the formal memory testing so I would cancel my appointment until they finish with Dr. Sima Matas, thanks

## 2019-10-06 DIAGNOSIS — Z85828 Personal history of other malignant neoplasm of skin: Secondary | ICD-10-CM | POA: Diagnosis not present

## 2019-10-06 DIAGNOSIS — C4359 Malignant melanoma of other part of trunk: Secondary | ICD-10-CM | POA: Diagnosis not present

## 2019-10-06 DIAGNOSIS — L988 Other specified disorders of the skin and subcutaneous tissue: Secondary | ICD-10-CM | POA: Diagnosis not present

## 2019-10-06 DIAGNOSIS — Z85820 Personal history of malignant melanoma of skin: Secondary | ICD-10-CM | POA: Diagnosis not present

## 2019-10-17 ENCOUNTER — Other Ambulatory Visit: Payer: Self-pay

## 2019-10-17 ENCOUNTER — Encounter: Payer: Medicare Other | Attending: Psychology | Admitting: Psychology

## 2019-10-17 DIAGNOSIS — Z820 Family history of epilepsy and other diseases of the nervous system: Secondary | ICD-10-CM | POA: Diagnosis not present

## 2019-10-17 DIAGNOSIS — R413 Other amnesia: Secondary | ICD-10-CM

## 2019-10-17 DIAGNOSIS — M109 Gout, unspecified: Secondary | ICD-10-CM | POA: Diagnosis not present

## 2019-10-17 DIAGNOSIS — G20 Parkinson's disease: Secondary | ICD-10-CM | POA: Diagnosis not present

## 2019-10-17 DIAGNOSIS — R4189 Other symptoms and signs involving cognitive functions and awareness: Secondary | ICD-10-CM | POA: Insufficient documentation

## 2019-10-17 DIAGNOSIS — E785 Hyperlipidemia, unspecified: Secondary | ICD-10-CM | POA: Insufficient documentation

## 2019-10-17 DIAGNOSIS — I10 Essential (primary) hypertension: Secondary | ICD-10-CM | POA: Insufficient documentation

## 2019-10-17 DIAGNOSIS — R251 Tremor, unspecified: Secondary | ICD-10-CM

## 2019-10-17 DIAGNOSIS — G20C Parkinsonism, unspecified: Secondary | ICD-10-CM

## 2019-10-27 ENCOUNTER — Other Ambulatory Visit: Payer: Self-pay

## 2019-10-27 ENCOUNTER — Encounter (HOSPITAL_COMMUNITY)
Admission: RE | Admit: 2019-10-27 | Discharge: 2019-10-27 | Disposition: A | Discharge status: Home / Self Care | Payer: Medicare Other | Source: Ambulatory Visit | Attending: Neurology | Admitting: Neurology

## 2019-10-27 ENCOUNTER — Ambulatory Visit (HOSPITAL_COMMUNITY)
Admission: RE | Admit: 2019-10-27 | Discharge: 2019-10-27 | Disposition: A | Discharge status: Home / Self Care | Payer: Medicare Other | Source: Ambulatory Visit | Attending: Neurology | Admitting: Neurology

## 2019-10-27 DIAGNOSIS — R251 Tremor, unspecified: Secondary | ICD-10-CM | POA: Diagnosis not present

## 2019-10-27 DIAGNOSIS — Z820 Family history of epilepsy and other diseases of the nervous system: Secondary | ICD-10-CM

## 2019-10-27 DIAGNOSIS — R4182 Altered mental status, unspecified: Secondary | ICD-10-CM | POA: Diagnosis not present

## 2019-10-27 DIAGNOSIS — R413 Other amnesia: Secondary | ICD-10-CM | POA: Diagnosis not present

## 2019-10-27 DIAGNOSIS — G20 Parkinson's disease: Secondary | ICD-10-CM | POA: Diagnosis not present

## 2019-10-27 MED ORDER — IODINE STRONG (LUGOLS) 5 % PO SOLN
ORAL | Status: AC
  Filled 2019-10-27: qty 1

## 2019-10-27 MED ORDER — IOFLUPANE I 123 185 MBQ/2.5ML IV SOLN
5.0000 | Freq: Once | INTRAVENOUS | Status: AC | PRN
  Administered 2019-10-27: 5 via INTRAVENOUS
  Filled 2019-10-27: qty 5

## 2019-10-27 MED ORDER — IODINE STRONG (LUGOLS) 5 % PO SOLN
0.8000 mL | Freq: Once | ORAL | Status: AC
  Administered 2019-10-27: 0.8 mL via ORAL

## 2019-11-07 DIAGNOSIS — Z1331 Encounter for screening for depression: Secondary | ICD-10-CM | POA: Diagnosis not present

## 2019-11-07 DIAGNOSIS — N183 Chronic kidney disease, stage 3 unspecified: Secondary | ICD-10-CM | POA: Diagnosis not present

## 2019-11-07 DIAGNOSIS — I10 Essential (primary) hypertension: Secondary | ICD-10-CM | POA: Diagnosis not present

## 2019-11-07 DIAGNOSIS — R634 Abnormal weight loss: Secondary | ICD-10-CM | POA: Diagnosis not present

## 2019-11-07 DIAGNOSIS — R413 Other amnesia: Secondary | ICD-10-CM | POA: Diagnosis not present

## 2019-11-07 DIAGNOSIS — E7849 Other hyperlipidemia: Secondary | ICD-10-CM | POA: Diagnosis not present

## 2019-11-11 ENCOUNTER — Encounter (HOSPITAL_BASED_OUTPATIENT_CLINIC_OR_DEPARTMENT_OTHER): Payer: Medicare Other | Admitting: Psychology

## 2019-11-11 ENCOUNTER — Other Ambulatory Visit: Payer: Self-pay

## 2019-11-11 ENCOUNTER — Encounter: Payer: Self-pay | Admitting: Psychology

## 2019-11-11 DIAGNOSIS — R251 Tremor, unspecified: Secondary | ICD-10-CM | POA: Diagnosis not present

## 2019-11-11 DIAGNOSIS — R4189 Other symptoms and signs involving cognitive functions and awareness: Secondary | ICD-10-CM | POA: Diagnosis not present

## 2019-11-11 DIAGNOSIS — I10 Essential (primary) hypertension: Secondary | ICD-10-CM | POA: Diagnosis not present

## 2019-11-11 DIAGNOSIS — E785 Hyperlipidemia, unspecified: Secondary | ICD-10-CM | POA: Diagnosis not present

## 2019-11-11 DIAGNOSIS — G20 Parkinson's disease: Secondary | ICD-10-CM | POA: Diagnosis not present

## 2019-11-11 DIAGNOSIS — R413 Other amnesia: Secondary | ICD-10-CM | POA: Diagnosis not present

## 2019-11-11 NOTE — Progress Notes (Addendum)
The patient arrived on time to his 13:00 testing appointment, which lasted 240 minutes.  Behavioral Observations:  Appearance: Formally and appropriately dressed with good hygiene. Gait: Ambulated independently without assistance. Slow pace.  Speech: Clear, normal rate, normal tone & volume. Mild WFD.  Thought process:  Linear, organized, and logical. Mild impulsivity and moderate inattention noted.  Mood/Affect: Euthymic, appropriate.  Interpersonal: Polite and appropriate. Orientation: Oriented x 4  Effort/Motivation: Good   He did not appear to have difficulty seeing, hearing, or understanding test instructions. He did display some inattention and did require additional prompting throughout the evaluation. He exhibited adequate distress tolerance on questions he did not know or tasks that were more difficult. He was cooperative with all assigned tasks.   Tests Administered: . Health and safety inspector . [place] (BNT) . Controlled Oral Word Association Test (COWAT)  . Grooved Pegboard  . [place]oper Visual Organization Test (HVOT) . Modified [employer] (M-WCST) . Trail Making Test (Part A & B) . Wechsler Adult Intelligence Scale, 4th Edition (WAIS-IV) . Wechsler Memory Scale, 4th edition, Older Adult Battery (WMS-IV-OA)  Results:    Animal Naming  . Total=27, T=88th % BNT . Total=60/60, T=70, 98th % COWAT . FAS Total=40, T=46, 34th %  o Repetitions: 8 o Intrusions: 2 Grooved Pegboard  . Dominant hand o Time=123", T=32, 4th % . Non Dominant  o Time=121", T=37, 9th % HVOT . Total=27, T=47, 37th % M-WCST . # Categories Correct  o Raw=4, T=37 10th %, Low Average . # Perseverative Errors  o Raw=9, T=38, 12th %, Low Average  . # Total Errors o Raw=18, T=41, 18th %, Low Average . % Perseverative Responses  o Raw=T=42, 21st %, Low Average . Executive Function Composite  o SS=75, 8th %, Borderline  Trail Making Test  . Part A o Time=28.42", T=55, 69th  % . Part B o Time=63.80", T=57, 75th %  WAIS-IV: Composite Score Summary  Scale Sum of Scaled Scores Composite Score Percentile Rank 95% Conf. Interval Qualitative Description  Verbal Comprehension 39 VCI 116 86 110-121 High Average  Perceptual Reasoning 45 PRI 129 97 121-134 Superior  Working Memory 21 WMI 102 55 95-109 Average  Processing Speed 27 PSI 120 91 110-126 Superior  Full Scale 132 FSIQ 122 93 117-126 Superior  General Ability 84 GAI 126 96 120-130 Superior   Index Level Discrepancy Comparisons  Comparison Score 1 Score 2 Difference Critical Value .05 Significant Difference Y/N Base Rate by Overall Sample  VCI - PRI 116 129 -13 8.31 Y 17.4  VCI - WMI 116 102 14 9.30 Y 14.1  VCI - PSI 116 120 -4 10.19 N 42.1  PRI - WMI 129 102 27 10.18 Y 2.3  PRI - PSI 129 120 9 11.00 N 27.9  WMI - PSI 102 120 -18 11.76 Y 10.7  FSIQ - GAI 122 126 -4 3.61 Y 23.8   Differences Between Subtest and Overall Mean of Subtest Scores  Subtest Subtest Scaled Score Mean Scaled Score Difference Critical Value .05 Strength or Weakness Base Rate  Block Design 14 13.20 0.80 2.85  >25%  Similarities 15 13.20 1.80 2.82  >25%  Digit Span 9 13.20 -4.20 2.22 W 2-5%  Matrix Reasoning 16 13.20 2.80 2.54 S 15-25%  Vocabulary 13 13.20 -0.20 2.03  >25%  Arithmetic 12 13.20 -1.20 2.73  >25%  Symbol Search 13 13.20 -0.20 3.42  >25%  Visual Puzzles 15 13.20 1.80 2.71  >25%  Information 11 13.20 -2.20 2.19  W >25%  Coding 14 13.20 0.80 2.97  >25%   WMS-IV (Older Adult)   Brief Cognitive Status Exam Classification  Age Years of Education Raw Score Classification Level Base Rate  73 years 5 months 21 44 Low 5.8   Index Score Summary  Index Sum of Scaled Scores Index Score Percentile Rank 95% Confidence Interval Qualitative Descriptor  Auditory Memory (AMI) 33 90 25 84-97 Average  Visual Memory (VMI) 7 62 1 58-68 Extremely Low  Immediate Memory (IMI) 27 93 32 87-100 Average  Delayed Memory (DMI) 13  65 1 60-75 Extremely Low   Primary Subtest Scaled Score Summary  Subtest Domain Raw Score Scaled Score Percentile Rank  Logical Memory I AM 33 11 63  Logical Memory II AM 2 3 1   Verbal Paired Associates I AM 21 10 50  Verbal Paired Associates II AM 5 9 37  Visual Reproduction I VM 21 6 9   Visual Reproduction II VM 0 1 0.1  Symbol Span VWM 16 10 50   Auditory Memory Process Score Summary  Process Score Raw Score Scaled Score Percentile Rank Cumulative Percentage (Base Rate)  LM II Recognition 18 - - 26-50%  VPA II Recognition 29 - - >75%   Visual Memory Process Score Summary  Process Score Raw Score Scaled Score Percentile Rank Cumulative Percentage (Base Rate)  VR II Recognition 3 - - 17-25%    ABILITY-MEMORY ANALYSIS  Ability Score:  GAI: 126 Date of Testing:  WAIS-IV; WMS-IV 2019/11/11  Predicted Difference Method   Index Predicted WMS-IV Index Score Actual WMS-IV Index Score Difference Critical Value  Significant Difference Y/N Base Rate  Auditory Memory 114 90 24 10.41 Y 3%  Visual Memory 115 62 53 7.89 Y <1%  Immediate Memory 117 93 24 9.97 Y 2%  Delayed Memory 115 65 50 12.33 Y <1%  Statistical significance (critical value) at the .01 level.   Contrast Scaled Scores  Score Score 1 Score 2 Contrast Scaled Score  General Ability Index vs. Auditory Memory Index 126 90 5  General Ability Index vs. Visual Memory Index 126 62 1  General Ability Index vs. Immediate Memory Index 126 93 5  General Ability Index vs. Delayed Memory Index 126 65 1  Verbal Comprehension Index vs. Auditory Memory Index 116 90 6  Perceptual Reasoning Index vs. Visual Memory Index 129 62 1  Working Memory Index vs. Auditory Memory Index 102 90 8

## 2019-11-14 ENCOUNTER — Encounter: Payer: Self-pay | Admitting: Psychology

## 2019-11-14 NOTE — Progress Notes (Signed)
Neuropsychological Consultation   Patient:   Albert Pierce   DOB:   1945-11-19  MR Number:  [id number]  Location:  [facility] [place], [place] [id number] [place] [address] Dept: [phone]           Date of Service:   10/17/2019  Start Time:   1 PM End Time:   3 PM  Today's visit was a 1 hour and 15-minute in person clinical interview along with the patient and his wife.  The other 45 minutes was spent with records review and report writing.  Provider/Observer:  Ilean Skill, Psy.D.       Clinical Neuropsychologist       Billing Code/Service: 96116/96121  Chief Complaint:    Albert Pierce is a 74 year old male referred by Dr. Jaynee Eagles with [facility] for neuropsychological evaluation due to progressing memory difficulties.  The patient has a past medical history of short-term memory loss, hypertension, hyperlipidemia, arthritis.  The patient reports that his mother and both of his siblings have been diagnosed with Parkinson's.  The patient reports that his memory difficulties started 79 or 19 months ago and his wife reports that she started noticing changes in 2019 along with her 2 daughters.  The patient and his wife reported that there have been occasional memory loss and by 2020 there were more symptoms and they started investigating what was happening and trying to figure out what could be done about his memory loss.  There was a question about the patient's ability to perform ADLs but the patient is described as being fine handling everything with the exception of feeling less confident when in a car without GPS.  The patient reports that he has been forgetting more things over the past few months.  The patient's wife is reported that he has forgotten going places even when they went for the entire weekend a year prior.  The patient is described  as repeating some statements multiple times in the same day.  The patient is described as the patient looking at the same paperwork over and over but not following through or getting completed.  This is described as issues like doing his taxes.  The patient is described as having some tremor/ache in 1 hand.  Tremor is described as being more with action versus resting tremor.  His voice is getting softer and handwriting is becoming increasingly hard to read.  No drooling or falls are noted.  Smell and taste is described as impaired.  The patient does drink a couple of large glasses of wine every other day.  Reason for Service:  Albert Pierce is a 74 year old male referred by Dr. Jaynee Eagles with [facility] for neuropsychological evaluation due to progressing memory difficulties.  The patient has a past medical history of short-term memory loss, hypertension, hyperlipidemia, arthritis.  The patient reports that his mother and both of his siblings have been diagnosed with Parkinson's.  The patient reports that his memory difficulties started 77 or 19 months ago and his wife reports that she started noticing changes in 2019 along with her 2 daughters.  The patient and his wife reported that there have been occasional memory loss and by 2020 there were more symptoms and they started investigating what was happening and trying to figure out what could be done about his memory loss.  There was a question about the patient's ability to perform  ADLs but the patient is described as being fine handling everything with the exception of feeling less confident when in a car without GPS.  The patient reports that he has been forgetting more things over the past few months.  The patient's wife is reported that he has forgotten going places even when they went for the entire weekend a year prior.  The patient is described as repeating some statements multiple times in the same day.  The patient is described as  the patient looking at the same paperwork over and over but not following through or getting completed.  This is described as issues like doing his taxes.  The patient is described as having some tremor/ache in 1 hand.  Tremor is described as being more with action versus resting tremor.  His voice is getting softer and handwriting is becoming increasingly hard to read.  No drooling or falls are noted.  Smell and taste is described as impaired.  The patient does drink a couple of large glasses of wine every day.  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  The patient describes any changes in expressive language functioning other than a little bit slower in his verbal production and a little bit more quiet in his pronunciation.  Both the patient his wife deny any circumlocution or paraphasic-type errors.  The patient described to is always been rather quiet and introverted and they deny any change in personality just a little bit more passivity.  The patient is described as not looking to engage in activities himself.  The patient describes himself as a good sleeper and that he may be sleeping a little bit longer than he has previously.  He denies any change in his appetite.  Reliability of Information: Information is derived from 1 hour face-to-face clinical interview with the patient as well as review of available medical records.  Behavioral Observation: Albert Pierce  presents as a 74 y.o.-year-old Right Caucasian Male who appeared his stated age. his dress was Appropriate and he was Well Groomed and his manners were Appropriate to the situation.  his participation was indicative of Appropriate and Attentive behaviors.  There were not any physical disabilities noted although there was some slight tremor noted.  he displayed an appropriate level of cooperation and motivation.     Interactions:    Active Appropriate and Attentive  Attention:   within normal limits and attention span  and concentration were age appropriate  Memory:   abnormal; remote memory intact, recent memory impaired  Visuo-spatial:  not examined  Speech (Volume):  low  Speech:   normal; normal  Thought Process:  Coherent and Relevant  Though Content:  WNL; not suicidal and not homicidal  Orientation:   person, place, time/date and situation  Judgment:   Good  Planning:   Good  Affect:    Appropriate  Mood:    Dysphoric  Insight:   Good  Intelligence:   high  Marital Status/Living: The patient was born and raised in [facility] along with a sister.  He continues to live with his wife of 81 years.  They were married in 1979 and have 2 daughters.  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  Hobbies and interests have include playing tennis and some golf.  Substance Use:  The patient describes some alcohol use and reports that he  generally has a glass or 2 of wine every other day but sometimes consecutive days.  Education:   The patient graduated from [employer] with a greater than 3.0 grade point average.  Medical History:   Past Medical History:  Diagnosis Date  . Appendicitis   . Arthritis   . Gout   . Hyperlipidemia   . Hypertension   . Short-term memory loss 2019   mild   Psychiatric History:  No prior psychiatric history noted.  Family Med/Psych History:  Family History  Problem Relation Age of Onset  . Parkinson's disease Mother   . Tuberculosis Maternal Grandfather   . Tuberculosis Paternal Grandfather   . Parkinson's disease Maternal Aunt   . Parkinson's disease Maternal Uncle   . Dementia Neg Hx   . Alzheimer's disease Neg Hx    Impression/DX:  Albert Pierce is a 74 year old male referred by Dr. Jaynee Eagles with [facility] for neuropsychological evaluation due to progressing memory difficulties.  The patient has a past  medical history of short-term memory loss, hypertension, hyperlipidemia, arthritis.  The patient reports that his mother and both of his siblings have been diagnosed with Parkinson's.  The patient reports that his memory difficulties started 23 or 19 months ago and his wife reports that she started noticing changes in 2019 along with her 2 daughters.  The patient and his wife reported that there have been occasional memory loss and by 2020 there were more symptoms and they started investigating what was happening and trying to figure out what could be done about his memory loss.  There was a question about the patient's ability to perform ADLs but the patient is described as being fine handling everything with the exception of feeling less confident when in a car without GPS.  The patient reports that he has been forgetting more things over the past few months.  The patient's wife is reported that he has forgotten going places even when they went for the entire weekend a year prior.  The patient is described as repeating some statements multiple times in the same day.  The patient is described as the patient looking at the same paperwork over and over but not following through or getting completed.  This is described as issues like doing his taxes.  The patient is described as having some tremor/ache in 1 hand.  Tremor is described as being more with action versus resting tremor.  His voice is getting softer and handwriting is becoming increasingly hard to read.  No drooling or falls are noted.  Smell and taste is described as impaired.  The patient does drink a couple of large glasses of wine every other day.   Disposition/Plan:  We have set the patient up for formal neuropsychological testing.  Once the objective neuropsychological measures have been completed a formal report will be generated with diagnostic considerations and recommendations and provided back to his referring neurologist as well as being  available in his EMR.  We will also set up a time to provide feedback to the patient and his wife regarding the results of this evaluation and further considerations.  Diagnosis:    Parkinsonism, unspecified Parkinsonism type ([place])  Tremor  Memory loss         Electronically Signed   _______________________ Ilean Skill, Psy.D.

## 2019-11-15 ENCOUNTER — Encounter (HOSPITAL_BASED_OUTPATIENT_CLINIC_OR_DEPARTMENT_OTHER): Payer: Medicare Other | Admitting: Psychology

## 2019-11-15 ENCOUNTER — Encounter: Payer: Medicare Other | Attending: Psychology | Admitting: Psychology

## 2019-11-15 ENCOUNTER — Encounter: Payer: Self-pay | Admitting: Psychology

## 2019-11-15 ENCOUNTER — Other Ambulatory Visit: Payer: Self-pay

## 2019-11-15 DIAGNOSIS — G3184 Mild cognitive impairment, so stated: Secondary | ICD-10-CM | POA: Diagnosis not present

## 2019-11-15 DIAGNOSIS — R4189 Other symptoms and signs involving cognitive functions and awareness: Secondary | ICD-10-CM | POA: Insufficient documentation

## 2019-11-15 DIAGNOSIS — E785 Hyperlipidemia, unspecified: Secondary | ICD-10-CM | POA: Insufficient documentation

## 2019-11-15 DIAGNOSIS — M109 Gout, unspecified: Secondary | ICD-10-CM | POA: Insufficient documentation

## 2019-11-15 DIAGNOSIS — I10 Essential (primary) hypertension: Secondary | ICD-10-CM | POA: Diagnosis not present

## 2019-11-15 DIAGNOSIS — R251 Tremor, unspecified: Secondary | ICD-10-CM | POA: Diagnosis not present

## 2019-11-15 DIAGNOSIS — G20 Parkinson's disease: Secondary | ICD-10-CM | POA: Diagnosis not present

## 2019-11-15 DIAGNOSIS — Z820 Family history of epilepsy and other diseases of the nervous system: Secondary | ICD-10-CM | POA: Diagnosis not present

## 2019-11-15 DIAGNOSIS — R413 Other amnesia: Secondary | ICD-10-CM | POA: Insufficient documentation

## 2019-11-15 NOTE — Progress Notes (Addendum)
Today I provided feedback to the patient and his wife regarding the results of the recent neuropsychological evaluation.  Below you can find the summary and impressions/diagnoses from that formal evaluation.  The complete report can be found in the patient's medical records dated 11/15/2019 and the full history and initial intake information can be found dated 10/17/2019.  Summary of Results:            Results of the current neuropsychological evaluation show some issues related to the patient's memory for both visual and auditory information, with the former more impacted. His delayed memory function was significantly below predicted levels based on his education and occupational history and placed at the 1st percentile compared to age matched peers. However, he appeared to have more trouble recalling contextualized information (e.g., short story) compared to rote material, and performed average on a word paired association test, suggesting some preserved function; he did demonstrate a reduced/flattened learning curve but was able to recall what he had learned after a 20-25-minute delay. Recognition memory was also somewhat variable compared to overall cognitive ability but within acceptable limits, which indicates that primary problems may be related to encoding and/or retrieval vs. storage capacity. Results note some problems with memory transfer and consolidation as well, but to a somewhat lesser degree.   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 His general fund of information and lexical fluency were also somewhat below predicted levels; he repeated himself and/or needed questions/instructions to be repeated often. Cognitive flexibility and set shifting was variable. Behaviorally, he showed some mild impulsivity. He demonstrated good distress tolerance. He displayed fair insight into his weaknesses and  memory challenges.   Performance was intact for most cognitive areas including aspects of verbal comprehension, visuospatial and constructional abilities, and processing speed (including visual scanning and searching abilities). visual attention.        IMPRESSION/DIAGNOSIS:     Results do suggest presence of significant neurocognitive decline compared to estimates of baseline functioning and appear inconsistent with normal aging. His relatively impaired performance on measures of delayed recall are not surprising given that recent MRI (10/03/19) was read to show some atrophy in the medial temporal lobes and corpus callosum (to a less degree) as well as microvascular ischemic changes. This does seem to be impacting his test scores at this point and is definitely a risk for additional cognitive difficulty in the future, especially if it progresses. Recent SPECT ([facility]; 10/27/19) found his dopaminergic pathways and basal ganglia intact and not suggestive for Parkinson's disease.   Given the nature of the patient's memory problems and weakness in aspects of attention and executive functioning, he is likely to get easily derailed when performing moderate-complex tasks (e.g., tax return, manage finances) and have less insight into errors or mistakes. In addition, he showed relatively good judgment when describing activities, he thinks he can perform without harm and good reasoning about different safety situations. Currently, he is reportedly able to perform most (if not all) daily activities (e.g., clean dishes, hygiene and self care, household chores, some yard work, Social research officer, government.).   Potential etiologies may include a combination of the sequelae stemming from the early effects of an underlying neurodegenerative condition such as Alzheimer's disease, chronic microvascular changes, excessive alcohol use (less likely), chronic hypertension. The current results likely reflect several of these factors working in  combination. More specifically, his average range performance on a verbal learning and memory test,  recognition trials, and aspects of language such as confrontation naming and semantic fluency are not generally consistent with pattern of performance associated with Korsakoff's syndrome (notably a purely amnestic syndrome) or cortical dementia such as Alzheimer's disease but are more commonly connected to subcortical deficits (e.g. vascular disease). Nevertheless, given family medical history (i.e., Parkinson's disease), current test scores, and recent imaging findings, we cannot rule-out the possibility of an underlying neurodegenerative disease process (at the sub-clinical stage).such as Alzheimer's Disease at this time.   The prognosis for further decline is medium, as he may experience further decline if his vascular risk factors are not well controlled or continue to progress if an underlying neurodegenerative process is present. However, the patient has several areas of strength and preserved functioning, making him a good candidate to utilize behavioral strategies in order to address his memory challenges and areas of weakness. With improved diet, sleep, exercise, and implementation of compensatory strategies, he may increase his overall efficiency and show some objective improvement in overall functioning. Monitoring the course of the patient's cognitive symptoms over time should be informative regarding the relative contributions of the various medical and psychological factors in explaining the current clinical picture. Positive prognostic factors include high cognitive reserve, intact cognitive and adaptive functioning, successful employment history, good social support, and effective interpersonal skills.  The patient and family members are encouraged to closely monitor the patient's cognitive and independent functioning over time. I will provide feedback to the patient regarding the results of this  objective evaluation and review relevant recommendations.   RECOMMENDATIONS:   1. Participate in regular cognitive activity and physical exercise (e.g. approved by physician) to improve blood and oxygen to the brain. Regular physical activity can improve cardiovascular health, boost energy levels, and exercise has been shown to improve cognition acutely and may delay cognitive decline. He should also continue to stay active and participate in family and local community related activities.  2. Utilize different compensatory strategies. For example, breaking up large amounts of information and complex tasks into smaller, more manageable parts will enhance encoding of information and completion of projects.  In addition, rehearsal and repetition of visual and auditory information will help with the encoding of new information. The use of external memory aids (i.e., checklist of daily tasks, lists identifying location of household items, calendars, reminder notes, etc.) would also help to free up cognitive resources and enhance recall  3. Functional neuroimaging (i.e., FDG PET) would help to clarify the current clinical picture.   4. Repeat neuropsychological testing in 9-12 months  5. The current evaluation will serve as a baseline measure from which comparisons will be made. Serial assessment will aid in developing optimal strategies for symptom management and enhancing quality of life.   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        Neuropsychologist  Neuropsychologist

## 2019-11-15 NOTE — Progress Notes (Addendum)
Neuropsychological Evaluation  Patient:                      Albert Pierce    DOB:                          1946/04/13   MR Number:              [id number]   Location:                   [facility], [place] [id number] [facility] [place] [address] Dept: [phone]                                                                                                             Date of Service:                     11/15/2019   Start Time:                              8 AM End Time:                                9 PM   Chief Complaint:     Memory Loss  possible parkinsonism (family Hx of PD)     Provider/Observer:              Ilean Skill, Psy.D.                                                   Clinical Neuropsychologist  Reason for Service:   Albert Pierce is a 74 year old male referred by Dr. Jaynee Pierce with [facility] for neuropsychological evaluation due to progressing memory difficulties.  The patient has a past medical history of short-term memory loss, hypertension, hyperlipidemia, arthritis.  The patient reports that his mother and both of his siblings have been diagnosed with Parkinson's.  The patient reports that his memory difficulties started 30 or 19 months ago and his wife reports that she started noticing changes in 2019 along with her 2 daughters.  The patient and his wife reported that there have been occasional memory loss and by 2020 there were more symptoms and they started investigating what was happening and trying to figure out what could be done about his memory loss.  There was a question about the patient's ability to perform ADLs but the patient is described as being fine handling everything with the exception of feeling less confident when in a car without GPS.  The patient reports that he has been forgetting more things over the past few  months.  The  patient's wife is reported that he has forgotten going places even when they went for the entire weekend a year prior.  The patient is described as repeating some statements multiple times in the same day.  The patient is described as the patient looking at the same paperwork over and over but not following through or getting completed.  This is described as issues like doing his taxes.  The patient is described as having some tremor/ache in 1 hand.  Tremor is described as being more with action versus resting tremor.  His voice is getting softer and handwriting is becoming increasingly hard to read.  No drooling or falls are noted.  Smell and taste is described as impaired.  The patient does drink a couple of large glasses of wine every day.   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  The patient describes any changes in expressive language functioning other than a little bit slower in his verbal production and a little bit more quiet in his pronunciation.  Both the patient his wife deny any circumlocution or paraphasic-type errors.  The patient described to is always been rather quiet and introverted and they deny any change in personality just a little bit more passivity.  The patient is described as not looking to engage in activities himself.  The patient describes himself as a good sleeper and that he may be sleeping a little bit longer than he has previously.  He denies any change in his appetite.  Mental Status & Behavioral Observations:  Appearance: Formally and appropriately dressed with good hygiene.   Gait: Ambulated independently without assistance. Slow pace.       Speech: Clear, normal rate, normal tone & volume. Mild WFD.         Thought process:  Linear, organized, and logical. Mild impulsivity & moderate inattention.  Mood/Affect: Euthymic, appropriate.        Interpersonal: Polite and appropriate.          Orientation: Oriented x 4            Effort/Motivation: Good    The patient did not appear to have difficulty seeing, hearing, or understanding test instructions. He did display some inattention and did require additional prompting throughout the evaluation. He exhibited adequate distress tolerance on questions he did not know or tasks that were more difficult. He was cooperative with all assigned tasks.  Tests Administered: . Health and safety inspector . [place] (BNT) . Controlled Oral Word Association Test (COWAT) . Grooved Pegboard . Hooper Visual Organization Test (HVOT) . Modified-[place] Systems developer (M-WCST) . Trail Making Test (TMT; Part A & B)  . Wechsler Adult Intelligence Scale, 4th Edition (WAIS-IV) . Wechsler Memory Scale, 4th edition, Older Adult Battery (WMS-IV-OA)  Test Results:   Initially, we established a prediction of baseline/premorbid global cognitive and intellectual functioning.  Based on the patient's work history and educational history the patient likely has historically performed in the High Average to Superior range. We will utilize a standard score of 115 as a conservative estimate of historical functioning for comparison.  Composite Score Summary  Scale Sum of Scaled Scores Composite Score Percentile Rank 95% Conf. Interval Qualitative Description  Verbal Comprehension 39 VCI 116 86 110-121 High Average  Perceptual Reasoning 45 PRI 129 97 121-134 Superior  Working Memory 21 WMI 102 55 95-109 Average  Processing Speed 27 PSI 120 91 110-126 Superior  Full Scale 132 FSIQ 122 93 117-126 Superior  General  Ability 84 GAI 126 96 120-130 Superior   The patient produced a full-scale IQ score of 122 which falls at the 96th percentile and is in the superior range.  This is consistent with predicted levels based on the patient's educational and occupational history and suggest overall cognitive abilities are persevered. The patient produced a similar score on the general abilities index  (GAI=126), which also fell in the superior range compared to age matched peers. This measure places less emphasis on the most acutely impacted measures and this comprehensive battery of cognitive measures.  This index places less emphasis on working memory and information processing speed variables.   Verbal Comprehension Subtests Summary  Subtest Raw Score Scaled Score Percentile Rank Reference Group Scaled Score SEM  Similarities 32 15 95 14 0.95  Vocabulary 49 13 84 14 0.67  Information 17 11 63 12 0.73   The patient produced a verbal comprehension index score of 116 which falls at the 86th percentile and is in the average range.  This is generally consistent with predicted levels and indicates that he is maintaining and preserving overall verbal comprehensive skills.  Individual subtests making up this measure shows that the patient is doing relatively well on measures of vocabulary and abstract verbal reasoning. His general fund of information was slightly weaker compared with this overall comprehension.   Perceptual Reasoning Subtests Summary  Subtest Raw Score Scaled Score Percentile Rank Reference Group Scaled Score SEM  Block Design 47 14 91 10 0.99  Matrix Reasoning 22 16 98 13 0.90  Visual Puzzles 17 15 95 11 0.99   The patient produced a perceptual reasoning index score of 129 which falls at the 97th percentile.  This performance falls in the superior range relative to a normative population and is commensurate with predicted levels of intellectual functioning. This pattern suggest visual spatial and visual constructional abilities are intact and slightly stronger compared to his verbal comprehension skills.   Working Doctor, general practice Raw Score Scaled Score Percentile Rank Reference Group Scaled Score SEM  Digit Span 22 9 37 7 0.73  Arithmetic 15 12 75 11 1.20   The patient produced a working memory index score of 102 which falls at the 55th percentile and is in the  average range of functioning.  This is slightly below predicted levels and may suggest that issues related to auditory encoding are relatively weak and below historical functioning for the patient. The patient displayed some variability in this domain as he performed slightly worse on pure auditory encoding test (WAIS-IV Digit Span=37th percentile than a timed measure of basic arithmetic (WAIS-IV Arithmetic=75th percentile)   Processing Speed Subtests Summary  Subtest Raw Score Scaled Score Percentile Rank Reference Group Scaled Score SEM  Symbol Search 31 13 84 9 1.12  Coding 70 14 91 9 1.12   The patient produced a processing speed index score of 120 which falls at the 91st percentile and is in the superior average range.  This is consistent with predicted levels and does not suggest a clinically meaningful reduction in overall information processing speed, visual scanning and visual searching abilities. Individual subtest making up this domain do not suggest difficulties with visual scanning.   The patient's auditory and visual encoding measures derived from both the WMS-IV (older adult Battery) as well as the WAIS-IV suggest no significant difference between these abilities and that they appear to be relatively intact.   HVOT . Total=27, T=47, 37th %  VISUOSPATIAL ABILITIES: Visuospatial abilities were  strong overall. He performed average on a measure of visual analytic and synthetic ability. There were no signs of visual-spatial inattention or visual perceptual defect. His ability to assemble two-dimensional block designs from models Product manager), solve visual spatial problems, and mentally reconstruct a puzzle using 3 separate pieces in a specified time limit was superior.   BNT . Total=60/60, T=70, 98th %  COWAT: FAS  . Total=40, T=46, 34th %  . Repetitions: 8, Below Expectation  . Intrusions: 2, Below Environmental education officer  . Total=27, T=88th %  LANGUAGE: The tone,  prosody, and fluency of the patient's speech were normal, with no clear errors or difficulty in speech and receptive language was intact. On tests of language ability, he showed relative strength naming (BNT) and mild weakness in lexical fluency (COWAT=34th percentile) for his age and education, with improved performance on semantic fluency (Animal naming=88th percentile). He committed more errors and intrusions on lexical fluency than expected.   M-WCST . # Categories Correct  o Raw=4, T=37 10th %, Low Average . # Perseverative Errors  o Raw=9, T=38, 12th %, Low Average  . # Total Errors o Raw=18, T=41, 18th %, Low Average . % Perseverative Responses  o Raw=T=42, 21st %, Low Average . Executive Function Composite  o SS=75, 8th %, Borderline   Trail Making Test (Normative data: [place], 2003)  . Part A o Time=28.42", T=55, 69th % . Part B o Time=63.80", T=57, 75th %  ATTENTION & EXECUTIVE FUNCTIONING:  Results on another measure of visual scanning and processing speed were found to be in the average range (Trails A=69th percentile), which provides additional support that processing speed remains intact overall. When a switching component was added, the patient's performance remained relatively the same, and placed in high end of the average range (Trails B=75th percentile); he did not make errors. His executive function composite score of 75, which fell in the borderline range compared to age and education matched peers is significantly below expectation compared to high average to superior premorbid ability. Overall, attention and executive functioning appears variable.   Brief Cognitive Status Exam Classification  Age Years of Education Raw Score Classification Level Base Rate  73 years 5 months 20 44 Low 5.8    Index Score Summary  Index Sum of Scaled Scores Index Score Percentile Rank 95% Confidence Interval Qualitative Descriptor  Auditory Memory (AMI) 33 90 25 84-97 Average   Visual Memory (VMI) 7 62 1 58-68 Extremely Low  Immediate Memory (IMI) 27 93 32 87-100 Average  Delayed Memory (DMI) 13 65 1 60-75 Extremely Low   The patient was then administered the [employer] Scale-for adults.  Breaking the patient's memory down between auditory versus visual memory the patient produced an auditory memory index score of 90 which falls at the 25th percentile and is in the average range overall. This is below expectation given predicted levels of historical functioning and suggests relative decline in auditory memory. His visual memory index score of 62 which falls at the 1st percentile is in the extremely low range and significantly below expectation.   Breaking memory components down between immediate versus delayed memory suggest that the patient is having clinically significant deficits with aspects of new learning and delayed visual memory.  The patient produced an immediate memory index score of 93 which falls at the 32nd percentile and is in the average range.   His delayed memory index score of 65 which fell at the 1st percentile in the extremely  low range is impaired compared to both predicted levels of intellectual functioning and immediate memory.   ABILITY-MEMORY ANALYSIS  Ability Score:  GAI: 126 Date of Testing:  WAIS-IV; WMS-IV 2019/11/11  Predicted Difference Method   Index Predicted WMS-IV Index Score Actual WMS-IV Index Score Difference Critical Value  Significant Difference Y/N Base Rate  Auditory Memory 114 90 24 10.41 Y 3%  Visual Memory 115 62 53 7.89 Y <1%  Immediate Memory 117 93 24 9.97 Y 2%  Delayed Memory 115 65 50 12.33 Y <1%  Statistical significance (critical value) at the .01 level.   Grooved Pegboard  . Dominant hand o Time=123", T=32, 4th % . Non Dominant  o Time=121", T=37, 9th %  FINE MOTOR COORDINATION: Results do not show any significant difference between dominant and non dominant hand with respect to motor dexterity.  However, his dominant (rt.) hand was found to be in the borderline range compared to age and education matched peers while his non-dominant hand was low average.    SUMMARY OF RESULTS: Results of the current neuropsychological evaluation show some issues related to the patient's memory for both visual and auditory information, with the former more impacted. His delayed memory function was significantly below predicted levels based on his education and occupational history and placed at the 1st percentile compared to age matched peers. However, he appeared to have more trouble recalling contextualized information (e.g., short story) compared to rote material, and performed average on a word paired association test, suggesting some preserved function; he did demonstrate a reduced/flattened learning curve but was able to recall what he had learned after a 20-25-minute delay. Recognition memory was also somewhat variable compared to overall cognitive ability but within acceptable limits, which indicates that primary problems may be related to encoding and/or retrieval vs. storage capacity. Results note some problems with memory transfer and consolidation as well, but to a somewhat lesser degree.   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 His general fund of information and lexical fluency were also somewhat below predicted levels; he repeated himself and/or needed questions/instructions to be repeated often. Cognitive flexibility and set shifting was variable. Behaviorally, he showed some mild impulsivity. He demonstrated good distress tolerance. He displayed fair insight into his weaknesses and memory challenges.   Performance was intact for most cognitive areas including aspects of verbal comprehension, visuospatial and constructional abilities, and processing speed (including visual scanning and searching  abilities), and visual attention.        IMPRESSION/DIAGNOSIS:     Results do suggest presence of significant neurocognitive decline compared to estimates of baseline functioning and appear inconsistent with normal aging. His relatively impaired performance on measures of delayed recall are not surprising given that recent MRI (10/03/19) was read to show some atrophy in the medial temporal lobes and corpus callosum (to a less degree) as well as microvascular ischemic changes. This does seem to be impacting his test scores at this point and is definitely a risk for additional cognitive difficulty in the future, especially if it progresses. Recent SPECT ([facility]; 10/27/19) found his dopaminergic pathways and basal ganglia intact and not suggestive for Parkinson's disease.   Given the nature of the patient's memory problems and weakness in aspects of attention and executive functioning, he is likely to get easily derailed when performing moderate-complex tasks (e.g., tax return, manage finances) and have less insight into errors or mistakes. In addition, he showed  relatively good judgment when describing activities, he thinks he can perform without harm and good reasoning about different safety situations. Currently, he is reportedly able to perform most (if not all) daily activities (e.g., clean dishes, hygiene and self care, household chores, some yard work, Social research officer, government.).   Potential etiologies may include a combination of the sequelae stemming from the early effects of an underlying neurodegenerative condition such as Alzheimer's disease, chronic microvascular changes, excessive alcohol use (less likely), chronic hypertension. The current results likely reflect several of these factors working in combination. More specifically, his average range performance on a verbal learning and memory test, recognition trials, and aspects of language such as confrontation naming and semantic fluency are not generally consistent  with pattern of performance associated with Korsakoff's syndrome (notably a purely amnestic syndrome) or cortical dementia such as Alzheimer's disease but are more commonly connected to subcortical deficits (e.g. vascular disease). Nevertheless, given family medical history (i.e., Parkinson's disease), current test scores, and recent imaging findings, we cannot rule-out the possibility of an underlying neurodegenerative disease process (at the sub-clinical stage).such as Alzheimer's Disease at this time.   The prognosis for further decline is medium, as he may experience further decline if his vascular risk factors are not well controlled or continue to progress if an underlying neurodegenerative process is present. However, the patient has several areas of strength and preserved functioning, making him a good candidate to utilize behavioral strategies in order to address his memory challenges and areas of weakness. With improved diet, sleep, exercise, and implementation of compensatory strategies, he may increase his overall efficiency and show some objective improvement in overall functioning. Monitoring the course of the patient's cognitive symptoms over time should be informative regarding the relative contributions of the various medical and psychological factors in explaining the current clinical picture. Positive prognostic factors include high cognitive reserve, intact cognitive and adaptive functioning, successful employment history, good social support, and effective interpersonal skills.  The patient and family members are encouraged to closely monitor the patient's cognitive and independent functioning over time. I will provide feedback to the patient regarding the results of this objective evaluation and review relevant recommendations.   RECOMMENDATIONS:   1. Participate in regular cognitive activity and physical exercise (e.g. approved by physician) to improve blood and oxygen to the brain.  Regular physical activity can improve cardiovascular health, boost energy levels, and exercise has been shown to improve cognition acutely and may delay cognitive decline. He should also continue to stay active and participate in family and local community related activities.  2. Utilize different compensatory strategies. For example, breaking up large amounts of information and complex tasks into smaller, more manageable parts will enhance encoding of information and completion of projects.  In addition, rehearsal and repetition of visual and auditory information will help with the encoding of new information. The use of external memory aids (i.e., checklist of daily tasks, lists identifying location of household items, calendars, reminder notes, etc.) would also help to free up cognitive resources and enhance recall  3. Functional neuroimaging (i.e., FDG PET) would help to clarify the current clinical picture.   4. Repeat neuropsychological testing in 9-12 months  5. The current evaluation will serve as a baseline measure from which comparisons will be made. Serial assessment will aid in developing optimal strategies for symptom management and enhancing quality of life.   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   Neuropsychologist     Neuropsychologist

## 2019-11-28 DIAGNOSIS — N183 Chronic kidney disease, stage 3 unspecified: Secondary | ICD-10-CM | POA: Diagnosis not present

## 2019-11-28 DIAGNOSIS — I10 Essential (primary) hypertension: Secondary | ICD-10-CM | POA: Diagnosis not present

## 2019-11-28 DIAGNOSIS — E7849 Other hyperlipidemia: Secondary | ICD-10-CM | POA: Diagnosis not present

## 2019-12-13 ENCOUNTER — Ambulatory Visit: Payer: Medicare Other | Admitting: Psychology

## 2019-12-16 DIAGNOSIS — H02403 Unspecified ptosis of bilateral eyelids: Secondary | ICD-10-CM | POA: Diagnosis not present

## 2019-12-16 DIAGNOSIS — H524 Presbyopia: Secondary | ICD-10-CM | POA: Diagnosis not present

## 2019-12-16 DIAGNOSIS — H2513 Age-related nuclear cataract, bilateral: Secondary | ICD-10-CM | POA: Diagnosis not present

## 2019-12-16 DIAGNOSIS — H43813 Vitreous degeneration, bilateral: Secondary | ICD-10-CM | POA: Diagnosis not present

## 2019-12-26 ENCOUNTER — Ambulatory Visit: Payer: Medicare Other | Admitting: Neurology

## 2020-01-03 ENCOUNTER — Ambulatory Visit: Payer: Medicare Other | Admitting: Psychology

## 2020-01-05 ENCOUNTER — Ambulatory Visit: Payer: Medicare Other | Admitting: Psychology

## 2020-01-18 DIAGNOSIS — Z85828 Personal history of other malignant neoplasm of skin: Secondary | ICD-10-CM | POA: Diagnosis not present

## 2020-01-18 DIAGNOSIS — L570 Actinic keratosis: Secondary | ICD-10-CM | POA: Diagnosis not present

## 2020-01-18 DIAGNOSIS — D225 Melanocytic nevi of trunk: Secondary | ICD-10-CM | POA: Diagnosis not present

## 2020-01-18 DIAGNOSIS — Z85820 Personal history of malignant melanoma of skin: Secondary | ICD-10-CM | POA: Diagnosis not present

## 2020-01-18 DIAGNOSIS — C44519 Basal cell carcinoma of skin of other part of trunk: Secondary | ICD-10-CM | POA: Diagnosis not present

## 2020-01-18 DIAGNOSIS — D485 Neoplasm of uncertain behavior of skin: Secondary | ICD-10-CM | POA: Diagnosis not present

## 2020-01-18 DIAGNOSIS — D2261 Melanocytic nevi of right upper limb, including shoulder: Secondary | ICD-10-CM | POA: Diagnosis not present

## 2020-01-18 DIAGNOSIS — D2262 Melanocytic nevi of left upper limb, including shoulder: Secondary | ICD-10-CM | POA: Diagnosis not present

## 2020-01-25 ENCOUNTER — Other Ambulatory Visit: Payer: Self-pay

## 2020-01-25 ENCOUNTER — Ambulatory Visit (INDEPENDENT_AMBULATORY_CARE_PROVIDER_SITE_OTHER): Payer: Medicare Other | Admitting: Cardiology

## 2020-01-25 ENCOUNTER — Encounter: Payer: Self-pay | Admitting: Cardiology

## 2020-01-25 VITALS — BP 152/71 | HR 47 | Temp 97.7°F | Ht 70.0 in | Wt 180.6 lb

## 2020-01-25 DIAGNOSIS — I350 Nonrheumatic aortic (valve) stenosis: Secondary | ICD-10-CM | POA: Diagnosis not present

## 2020-01-25 DIAGNOSIS — I10 Essential (primary) hypertension: Secondary | ICD-10-CM

## 2020-01-25 DIAGNOSIS — I77819 Aortic ectasia, unspecified site: Secondary | ICD-10-CM | POA: Diagnosis not present

## 2020-01-25 DIAGNOSIS — R001 Bradycardia, unspecified: Secondary | ICD-10-CM | POA: Diagnosis not present

## 2020-01-25 MED ORDER — VALSARTAN 160 MG PO TABS
160.0000 mg | ORAL_TABLET | Freq: Every day | ORAL | 3 refills | Status: DC
Start: 2020-01-25 — End: 2020-01-25

## 2020-01-25 MED ORDER — VALSARTAN 160 MG PO TABS: 160.0000 mg | ORAL_TABLET | Freq: Every day | ORAL | 0 refills | Status: DC

## 2020-01-25 NOTE — Patient Instructions (Signed)
Medication Instructions:  STOP atenolol STOP telmisartan START valsartan 160 mg daily  *If you need a refill on your cardiac medications before your next appointment, please call your pharmacy*  Testing/Procedures: Your physician has requested that you have an echocardiogram. Echocardiography is a painless test that uses sound waves to create images of your heart. It provides your doctor with information about the size and shape of your heart and how well your heart's chambers and valves are working. This procedure takes approximately one hour. There are no restrictions for this procedure.   ZIO XT- Long Term Monitor Instructions   Your physician has requested you wear your ZIO patch monitor 3 days.   This is a single patch monitor.  [employer] supplies one patch monitor per enrollment.  Additional stickers are not available.   Please do not apply patch if you will be having a Nuclear Stress Test, Echocardiogram, Cardiac CT, MRI, or Chest Xray during the time frame you would be wearing the monitor. The patch cannot be worn during these tests.  You cannot remove and re-apply the ZIO XT patch monitor.   Your ZIO patch monitor will be sent USPS Priority mail from [facility] directly to your home address. The monitor may also be mailed to a PO BOX if home delivery is not available.   It may take 3-5 days to receive your monitor after you have been enrolled.   Once you have received you monitor, please review enclosed instructions.  Your monitor has already been registered assigning a specific monitor serial # to you.   Applying the monitor   Shave hair from upper left chest.   Hold abrader disc by orange tab.  Rub abrader in 40 strokes over left upper chest as indicated in your monitor instructions.   Clean area with 4 enclosed alcohol pads .  Use all pads to assure are is cleaned thoroughly.  Let dry.   Apply patch as indicated in monitor instructions.  Patch will be place under  collarbone on left side of chest with arrow pointing upward.   Rub patch adhesive wings for 2 minutes.Remove white label marked "1".  Remove white label marked "2".  Rub patch adhesive wings for 2 additional minutes.   While looking in a mirror, press and release button in center of patch.  A small green light will flash 3-4 times .  This will be your only indicator the monitor has been turned on.     Do not shower for the first 24 hours.  You may shower after the first 24 hours.   Press button if you feel a symptom. You will hear a small click.  Record Date, Time and Symptom in the Patient Log Book.   When you are ready to remove patch, follow instructions on last 2 pages of Patient Log Book.  Stick patch monitor onto last page of Patient Log Book.   Place Patient Log Book in [place] box.  Use locking tab on box and tape box closed securely.  The [employer] has [employer] on it.  Please place in mailbox as soon as possible.  Your physician should have your test results approximately 7 days after the monitor has been mailed back to [facility].   Call [place] at [phone] if you have questions regarding your ZIO XT patch monitor.  Call them immediately if you see an orange light blinking on your monitor.   If your monitor falls off in less than  4 days contact our Monitor department at [phone].  If your monitor becomes loose or falls off after 4 days call [employer] at [phone] for suggestions on securing your monitor.   Follow-Up: At [facility], you and your health needs are our priority.  As part of our continuing mission to provide you with exceptional heart care, we have created designated Provider Care Teams.  These Care Teams include your primary Cardiologist (physician) and Advanced Practice Providers (APPs -  Physician Assistants and Nurse Practitioners) who all work together to provide you with the care you need, when you need it.  We  recommend signing up for the patient portal called "MyChart".  Sign up information is provided on this After Visit Summary.  MyChart is used to connect with patients for Virtual Visits (Telemedicine).  Patients are able to view lab/test results, encounter notes, upcoming appointments, etc.  Non-urgent messages can be sent to your provider as well.   To learn more about what you can do with MyChart, go to [email or web address].    Your next appointment:   3 month(s)  The format for your next appointment:   In Person  Provider:   Oswaldo Milian, MD   Other Instructions Please check your blood pressure at home daily, write it down.  Call the office or send message via Mychart with the readings in 1 week for Dr. Gardiner Rhyme to review.

## 2020-01-25 NOTE — Progress Notes (Signed)
Cardiology Office Note:    Date:  01/27/2020   ID:  Albert Pierce, DOB 07-14-1946, MRN [id number]  PCP:  Prince Solian, MD  Cardiologist:  No primary care provider on file.  Electrophysiologist:  None   Referring MD: Prince Solian, MD   Chief Complaint  Patient presents with  . Hypertension    History of Present Illness:    Albert Pierce is a 74 y.o. male with a hx of hypertension, mild aortic stenosis who is referred by Caprice Beaver, PA for evaluation of bradycardia.  He was noted at recent clinic visit to have sinus bradycardia with rate 46.  He brought a log of his BP/pulse with him, and pulse has been as low as 39 at home.  Current medications include atenolol 25 mg daily, amlodipine 10 mg daily, and telmisartan 80 mg daily.  He denies any lightheadedness, syncope, fatigue, chest pain, dyspnea, or palpitations.  States that he exercises by walking his dog about 4 times per week for 20 to 30 minutes.   Past Medical History:  Diagnosis Date  . Appendicitis   . Arthritis   . Gout   . Hyperlipidemia   . Hypertension   . Short-term memory loss 2019   mild    Past Surgical History:  Procedure Laterality Date  . APPENDECTOMY     73 y.o.  . COLONOSCOPY    . EYE MUSCLE SURGERY    . UMBILICAL HERNIA REPAIR  2016   Dr Dalbert Batman    Current Medications: No outpatient medications have been marked as taking for the 01/25/20 encounter (Office Visit) with Donato Heinz, MD.     Allergies:   Patient has no known allergies.   Social History   Socioeconomic History  . Marital status: Married    Spouse name: Not on file  . Number of children: 3  . Years of education: 3  . Highest education level: Professional school degree (e.g., MD, DDS, DVM, JD)  Occupational History  . Not on file  Tobacco Use  . Smoking status: Never Smoker  . Smokeless tobacco: Never Used  Substance and Sexual Activity  . Alcohol use: Yes    Alcohol/week: 2.0 - 8.0 standard drinks     Types: 2 - 8 Standard drinks or equivalent per week    Comment: generally wine, occ beer or hard liquor   . Drug use: No  . Sexual activity: Not on file  Other Topics Concern  . Not on file  Social History Narrative   Lives at home with wife   Right handed   Caffeine: diet coke, 2-4 cups/day   Social Determinants of Health   Financial Resource Strain:   . Difficulty of Paying Living Expenses:   Food Insecurity:   . Worried About Charity fundraiser in the Last Year:   . Arboriculturist in the Last Year:   Transportation Needs:   . Film/video editor (Medical):   Marland Kitchen Lack of Transportation (Non-Medical):   Physical Activity:   . Days of Exercise per Week:   . Minutes of Exercise per Session:   Stress:   . Feeling of Stress :   Social Connections:   . Frequency of Communication with Friends and Family:   . Frequency of Social Gatherings with Friends and Family:   . Attends Religious Services:   . Active Member of Clubs or Organizations:   . Attends Archivist Meetings:   Marland Kitchen Marital Status:  Family History: The patient's family history includes Parkinson's disease in his maternal aunt, maternal uncle, and mother; Tuberculosis in his maternal grandfather and paternal grandfather. There is no history of Dementia or Alzheimer's disease.  ROS:   Please see the history of present illness.    All other systems reviewed and are negative.  EKGs/Labs/Other Studies Reviewed:    The following studies were reviewed today:  EKG:  EKG is ordered today.  The ekg ordered today demonstrates sinus rhythm, rate 47, left axis deviation, nonspecific T wave flattening  Recent Labs: 08/23/2019: BUN 18; Creatinine, Ser 1.09; Potassium 4.4; Sodium 144  Recent Lipid Panel    Component Value Date/Time   CHOL 164 02/14/2014 0923   TRIG 132.0 02/14/2014 0923   TRIG 264 (HH) 05/27/2006 0848   HDL 35.30 (L) 02/14/2014 0923   CHOLHDL 5 02/14/2014 0923   VLDL 26.4 02/14/2014  0923   LDLCALC 102 (H) 02/14/2014 0923   LDLDIRECT 79.1 04/17/2009 0819    Physical Exam:    VS:  BP (!) 152/71   Pulse (!) 47   Temp 97.7 F (36.5 C)   Ht 5\' 10"  (1.778 m)   Wt 180 lb 9.6 oz (81.9 kg)   SpO2 97%   BMI 25.91 kg/m     Wt Readings from Last 3 Encounters:  01/25/20 180 lb 9.6 oz (81.9 kg)  08/23/19 185 lb (83.9 kg)  02/14/14 215 lb (97.5 kg)     GEN:  in no acute distress HEENT: Normal NECK: No JVD; No carotid bruits LYMPHATICS: No lymphadenopathy CARDIAC: regular, bradycardic, no murmurs, rubs, gallops RESPIRATORY:  Clear to auscultation without rales, wheezing or rhonchi  ABDOMEN: Soft, non-tender, non-distended MUSCULOSKELETAL:  No edema; No deformity  SKIN: Warm and dry NEUROLOGIC:  Alert and oriented x 3 PSYCHIATRIC:  Normal affect   ASSESSMENT:    1. Bradycardia   2. Aortic valve stenosis, etiology of cardiac valve disease unspecified   3. Essential hypertension   4. Aortic dilatation (HCC)    PLAN:    Sinus bradycardia: Heart rate as low as high 30s at home.  He appears asymptomatic.  Suspect bradycardia is secondary to atenolol use.  Will discontinue atenolol.  Hypertension: atenolol 25 mg daily, amlodipine 10 mg daily, and telmisartan 80 mg daily.  Discontinuing atenolol given bradycardia as above.  Will switch telmisartan to valsartan 160 mg daily.  Suspect will need to titrate valsartan to 320 mg daily since discontinuing atenolol.  Asked patient to check BP daily for next week and call with results.  Aortic stenosis: Mild AS/AI on echo in 05/2018.  Will monitor with repeat echocardiogram  Aortic dilatation: Ascending aorta measured 41 mm on echo on 05/2018.  Will repeat echo as above and will likely need CTA or MRA to monitor dilatation pending the echo results  RTC in 3 months  Medication Adjustments/Labs and Tests Ordered: Current medicines are reviewed at length with the patient today.  Concerns regarding medicines are outlined  above.  Orders Placed This Encounter  Procedures  . LONG TERM MONITOR (3-14 DAYS)  . EKG 12-Lead  . ECHOCARDIOGRAM COMPLETE   Meds ordered this encounter  Medications  . DISCONTD: valsartan (DIOVAN) 160 MG tablet    Sig: Take 1 tablet (160 mg total) by mouth daily.    Dispense:  90 tablet    Refill:  3  . valsartan (DIOVAN) 160 MG tablet    Sig: Take 1 tablet (160 mg total) by mouth daily.    Dispense:  30 tablet    Refill:  0    Patient Instructions  Medication Instructions:  STOP atenolol STOP telmisartan START valsartan 160 mg daily  *If you need a refill on your cardiac medications before your next appointment, please call your pharmacy*  Testing/Procedures: Your physician has requested that you have an echocardiogram. Echocardiography is a painless test that uses sound waves to create images of your heart. It provides your doctor with information about the size and shape of your heart and how well your heart's chambers and valves are working. This procedure takes approximately one hour. There are no restrictions for this procedure.   ZIO XT- Long Term Monitor Instructions   Your physician has requested you wear your ZIO patch monitor 3 days.   This is a single patch monitor.  [employer] supplies one patch monitor per enrollment.  Additional stickers are not available.   Please do not apply patch if you will be having a Nuclear Stress Test, Echocardiogram, Cardiac CT, MRI, or Chest Xray during the time frame you would be wearing the monitor. The patch cannot be worn during these tests.  You cannot remove and re-apply the ZIO XT patch monitor.   Your ZIO patch monitor will be sent USPS Priority mail from [facility] directly to your home address. The monitor may also be mailed to a PO BOX if home delivery is not available.   It may take 3-5 days to receive your monitor after you have been enrolled.   Once you have received you monitor, please review enclosed  instructions.  Your monitor has already been registered assigning a specific monitor serial # to you.   Applying the monitor   Shave hair from upper left chest.   Hold abrader disc by orange tab.  Rub abrader in 40 strokes over left upper chest as indicated in your monitor instructions.   Clean area with 4 enclosed alcohol pads .  Use all pads to assure are is cleaned thoroughly.  Let dry.   Apply patch as indicated in monitor instructions.  Patch will be place under collarbone on left side of chest with arrow pointing upward.   Rub patch adhesive wings for 2 minutes.Remove white label marked "1".  Remove white label marked "2".  Rub patch adhesive wings for 2 additional minutes.   While looking in a mirror, press and release button in center of patch.  A small green light will flash 3-4 times .  This will be your only indicator the monitor has been turned on.     Do not shower for the first 24 hours.  You may shower after the first 24 hours.   Press button if you feel a symptom. You will hear a small click.  Record Date, Time and Symptom in the Patient Log Book.   When you are ready to remove patch, follow instructions on last 2 pages of Patient Log Book.  Stick patch monitor onto last page of Patient Log Book.   Place Patient Log Book in [place] box.  Use locking tab on box and tape box closed securely.  The [employer] has [employer] on it.  Please place in mailbox as soon as possible.  Your physician should have your test results approximately 7 days after the monitor has been mailed back to [facility].   Call [place] at [phone] if you have questions regarding your ZIO XT patch monitor.  Call them immediately if you see an orange light  blinking on your monitor.   If your monitor falls off in less than 4 days contact our Monitor department at [phone].  If your monitor becomes loose or falls off after 4 days call [employer] at [phone] for  suggestions on securing your monitor.   Follow-Up: At [facility], you and your health needs are our priority.  As part of our continuing mission to provide you with exceptional heart care, we have created designated Provider Care Teams.  These Care Teams include your primary Cardiologist (physician) and Advanced Practice Providers (APPs -  Physician Assistants and Nurse Practitioners) who all work together to provide you with the care you need, when you need it.  We recommend signing up for the patient portal called "MyChart".  Sign up information is provided on this After Visit Summary.  MyChart is used to connect with patients for Virtual Visits (Telemedicine).  Patients are able to view lab/test results, encounter notes, upcoming appointments, etc.  Non-urgent messages can be sent to your provider as well.   To learn more about what you can do with MyChart, go to [email or web address].    Your next appointment:   3 month(s)  The format for your next appointment:   In Person  Provider:   Oswaldo Milian, MD   Other Instructions Please check your blood pressure at home daily, write it down.  Call the office or send message via Mychart with the readings in 1 week for Dr. Gardiner Rhyme to review.       Signed, Donato Heinz, MD  01/27/2020 2:34 PM    [place]

## 2020-01-26 ENCOUNTER — Telehealth: Payer: Self-pay | Admitting: Radiology

## 2020-01-26 ENCOUNTER — Telehealth: Payer: Self-pay | Admitting: Cardiology

## 2020-01-26 NOTE — Telephone Encounter (Signed)
Left message for patient to call and schedule Echo and 3 month follow up visit with Dr. Gardiner Rhyme

## 2020-01-26 NOTE — Telephone Encounter (Signed)
Enrolled patient for a 3 day Zio monitor to be mailed to patients home. Per patients request monitor will be shipped around 7/28

## 2020-02-01 ENCOUNTER — Telehealth: Payer: Self-pay | Admitting: Cardiology

## 2020-02-01 DIAGNOSIS — I10 Essential (primary) hypertension: Secondary | ICD-10-CM

## 2020-02-01 NOTE — Telephone Encounter (Signed)
Will route to MD/RN to make aware of BP readings.

## 2020-02-01 NOTE — Telephone Encounter (Signed)
Recommend increasing valsartan to 320 mg daily.  Check BP daily for next 2 weeks and call with results.  Check BMET in 1 week

## 2020-02-01 NOTE — Telephone Encounter (Signed)
Pt c/o BP issue: STAT if pt c/o blurred vision, one-sided weakness or slurred speech  1. What are your last 5 BP readings?  01/24/20 155/81 HR 48 01/25/20 161/85 HR 40 01/26/20 143/78 HR 45 01/27/20 134/74 HR 43 01/29/20 148/88 HR 49 01/30/20 156/103 HR 46 01/31/20 162/86 HR 51 02/01/20 153/80 HR 45  2. Are you having any other symptoms (ex. Dizziness, headache, blurred vision, passed out)? No   3. What is your BP issue? Z[place] was advised to call in with BP readings.

## 2020-02-02 MED ORDER — VALSARTAN 320 MG PO TABS: 320.0000 mg | ORAL_TABLET | Freq: Every day | ORAL | 0 refills | Status: DC

## 2020-02-02 NOTE — Telephone Encounter (Signed)
Left message to call back  

## 2020-02-02 NOTE — Telephone Encounter (Signed)
Returned call to patient: request 7 day rx sent to [facility] in [place]  Phone # [phone]   rx sent

## 2020-02-02 NOTE — Telephone Encounter (Signed)
Returned call to patient, aware of recommendations and verbalized understanding.   Patient is in [place] and will be coming home on 7/31.   He will need a prescription to last him until he gets home.  He will call or send message via Mychart with pharmacy information.   He is aware to get lab work on return and call in 2 weeks with BP readings.

## 2020-02-02 NOTE — Telephone Encounter (Signed)
Patient is returning your call.  

## 2020-02-02 NOTE — Telephone Encounter (Signed)
Pt is returning nurses phone call.  

## 2020-02-13 ENCOUNTER — Ambulatory Visit (HOSPITAL_COMMUNITY): Payer: Medicare Other | Attending: Cardiology

## 2020-02-13 ENCOUNTER — Other Ambulatory Visit: Payer: Self-pay

## 2020-02-13 DIAGNOSIS — I350 Nonrheumatic aortic (valve) stenosis: Secondary | ICD-10-CM | POA: Insufficient documentation

## 2020-02-13 LAB — ECHOCARDIOGRAM COMPLETE
AR max vel: 1.89 cm2
AV Area VTI: 1.89 cm2
AV Area mean vel: 1.87 cm2
AV Mean grad: 16 mmHg
AV Peak grad: 31.4 mmHg
Ao pk vel: 2.8 m/s
Area-P 1/2: 2.75 cm2
P 1/2 time: 641 msec
S' Lateral: 1.9 cm

## 2020-02-14 ENCOUNTER — Ambulatory Visit (INDEPENDENT_AMBULATORY_CARE_PROVIDER_SITE_OTHER): Payer: Medicare Other

## 2020-02-14 DIAGNOSIS — R001 Bradycardia, unspecified: Secondary | ICD-10-CM | POA: Diagnosis not present

## 2020-02-16 ENCOUNTER — Ambulatory Visit: Payer: Medicare Other | Admitting: Psychology

## 2020-02-16 ENCOUNTER — Telehealth: Payer: Self-pay | Admitting: Cardiology

## 2020-02-16 NOTE — Telephone Encounter (Signed)
New Message:     Pt called to report his blood pressure readings.   02-09-20- 155/80 pulse 49  02-10-20-  163/90 pulse 70  02-11-20-   150/83 pulse 65  02-12-20-       142/84 pulse 53   02-13-20-          148/86 pulse 61  02-14-20-           143/84 pulse 55  02-15-20-            125/84 pulse 78

## 2020-02-17 ENCOUNTER — Other Ambulatory Visit: Payer: Self-pay | Admitting: Cardiology

## 2020-02-20 DIAGNOSIS — I129 Hypertensive chronic kidney disease with stage 1 through stage 4 chronic kidney disease, or unspecified chronic kidney disease: Secondary | ICD-10-CM | POA: Diagnosis not present

## 2020-02-20 DIAGNOSIS — R413 Other amnesia: Secondary | ICD-10-CM | POA: Diagnosis not present

## 2020-02-20 DIAGNOSIS — R001 Bradycardia, unspecified: Secondary | ICD-10-CM | POA: Diagnosis not present

## 2020-02-20 DIAGNOSIS — N182 Chronic kidney disease, stage 2 (mild): Secondary | ICD-10-CM | POA: Diagnosis not present

## 2020-02-20 NOTE — Telephone Encounter (Signed)
BP remains elevated, will need to add additional medication, likely chlorthalidone, but need to check labs first as he has not had BMET since increasing his valsartan.  Recommend reminding patient he needs BMET checked.

## 2020-02-21 NOTE — Progress Notes (Signed)
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  Formal neurocognitive testing showed etiologies of memory loss may be from a combination of an underlying neurodegenerative condition such as Alzheimer's disease in association with chronic microvascular changes possibly alcohol use.  B12 was 283 in the low range however methylmalonic acid was normal still I recommended B12 supplementation.  Otherwise homocysteine, vitamin B1, thiamine, RPR, normal  Here with his wife who provides much information. They found the formal memory testing helpful. We reviewed the results, discussed donepezil, reviewed imaging with patient and wife, discussed FDG PET Testing and referral to [facility] which they have requested. Answered all questions.  HPI:  Albert Pierce is a 74 y.o. male here as requested by Prince Solian, MD for memory loss. PMHx short-term memory loss, HTN, HLD, Arthritis. Patient here with his wife, he says he is not as sharp as he once was. He forgets things sometimes, he says it has been worse over the past few months. His wife provides most information. It has been at least 18 months, when he turned 87 patient's wife noticed. Patient uses his GPS for new places, also places he has been before just to make sure like if he [place] to the [place] he may use the GPS even though he knows how to get there, he says this is just for safety. Wife says he doesn't remember going someplace for the weekend a  year ago, she has noticed a pattern. He will repeat the same statement multiple times in the same day. Daughters have noticed this too. 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 Wife of 15 years. He may have a slight shake in one hand. His mother, maternal aunt and maternal uncle had Parkinson's disease, his older sister is not affected and does not have memory loss. Wife is very frightened there is dementia starting. He needs his wife to help with finances now. He can perform all his ADLs but is need more help with IADLs such as managing money. Tremor is more with action vs resting tremor but she is worried about it, family has noticed it. His voice is getting softer, handwriting hard to read, his voice is softer. No drooling. No falls. Smell and taste is impaired. He drinks 1-2 glasses of wine a day of a lrage pour, they usually finish a bottle red and white.   Patient complains of symptoms per HPI as well as the following symptoms: memory loss . Pertinent negatives and positives per HPI. All others negative    Social History   Socioeconomic History   Marital status: Married    Spouse name: Not on file   Number of children: 3   Years of education: 82   Highest education level: Professional school degree (e.g., MD, DDS, DVM, JD)  Occupational History   Not on file  Tobacco Use   Smoking status: Never Smoker  Smokeless tobacco: Never Used  Substance and Sexual Activity   Alcohol use: Yes    Alcohol/week: 2.0 - 8.0 standard drinks    Types: 2 - 8 Standard drinks or equivalent per week    Comment: generally wine, occ beer or hard liquor    Drug use: No   Sexual activity: Not on file  Other Topics Concern   Not on file  Social History Narrative   Lives at home with wife   Right handed   Caffeine: diet coke, 2-4 cups/day   Social Determinants of  Health   Financial Resource Strain:    Difficulty of Paying Living Expenses:   Food Insecurity:    Worried About Charity fundraiser in the Last Year:    Arboriculturist in the Last Year:   Transportation Needs:    Film/video editor (Medical):    Lack of Transportation (Non-Medical):   Physical Activity:    Days of Exercise per Week:    Minutes of Exercise per Session:   Stress:    Feeling of Stress :   Social Connections:    Frequency of Communication with Friends and Family:    Frequency of Social Gatherings with Friends and Family:    Attends Religious Services:    Active Member of Clubs or Organizations:    Attends Music therapist:    Marital Status:   Intimate Partner Violence:    Fear of Current or Ex-Partner:    Emotionally Abused:    Physically Abused:    Sexually Abused:     Family History  Problem Relation Age of Onset   Parkinson's disease Mother    Tuberculosis Maternal Grandfather    Tuberculosis Paternal Grandfather    Parkinson's disease Maternal Aunt    Parkinson's disease Maternal Uncle    Dementia Neg Hx    Alzheimer's disease Neg Hx     Past Medical History:  Diagnosis Date   Appendicitis    Arthritis    Gout    Hyperlipidemia    Hypertension    Short-term memory loss 2019   mild    Patient Active Problem List   Diagnosis Date Noted   Mild cognitive impairment with memory loss 02/22/2020   GOUT 12/31/2007   HYPERLIPIDEMIA 03/25/2007   HYPERTENSION 03/25/2007    Past Surgical History:  Procedure Laterality Date   APPENDECTOMY     74 y.o.   COLONOSCOPY     EYE MUSCLE SURGERY     UMBILICAL HERNIA REPAIR  2016   Dr Dalbert Batman    Current Outpatient Medications  Medication Sig Dispense Refill   amLODipine (NORVASC) 5 MG tablet TAKE ONE TABLET BY MOUTH ONCE DAILY (Patient taking differently: Take 10 mg by mouth daily. Take 2 tablets Daily at night) 90 tablet 3   aspirin 81 MG  tablet Take 81 mg by mouth daily.       colchicine 0.6 MG tablet Take 1 tablet (0.6 mg total) by mouth 3 (three) times daily as needed. 20 tablet 1   fish oil-omega-3 fatty acids 1000 MG capsule Take 3,000 mg by mouth daily.      rosuvastatin (CRESTOR) 10 MG tablet Take 10 mg by mouth daily.     valsartan (DIOVAN) 320 MG tablet Take 1 tablet (320 mg total) by mouth daily. 7 tablet 0   No current facility-administered medications for this visit.    Allergies as of 02/22/2020   (No Known Allergies)    Vitals: BP 118/68 (  BP Location: Right Arm, Patient Position: Sitting)    Pulse 62    Ht 5\' 10"  (1.778 m)    Wt 181 lb (82.1 kg)    BMI 25.97 kg/m  Last Weight:  Wt Readings from Last 1 Encounters:  02/22/20 181 lb (82.1 kg)   Last Height:   Ht Readings from Last 1 Encounters:  02/22/20 5\' 10"  (1.778 m)     Physical exam: Stable Exam: Gen: NAD, conversant, well nourised,  well groomed                     CV: RRR, no MRG. No Carotid Bruits. No peripheral edema, warm, nontender Eyes: Conjunctivae clear without exudates or hemorrhage  Neuro: Stable Detailed Neurologic Exam  Speech:    Speech is normal; fluent and spontaneous with normal comprehension.  Cognition:  MMSE - Mini Mental State Exam 08/23/2019  Orientation to time 5  Orientation to Place 5  Registration 3  Attention/ Calculation 5  Recall 3  Language- name 2 objects 2  Language- repeat 1  Language- follow 3 step command 3  Language- read & follow direction 1  Write a sentence 0  Copy design 1  Total score 29   Cranial Nerves: Hypomimia.     The pupils are equal, round, and reactive to light. Could not visualize fundi.. Visual fields are full to finger confrontation. Impaired upgaze. Trigeminal sensation is intact and the muscles of mastication are normal. Left ptosis(chronic). The palate elevates in the midline. Hearing intact. Voice is normal. Shoulder shrug is normal. The tongue has normal motion without  fasciculations.   Coordination:    Normal finger to nose and heel to shin.   Gait:    Heel-toe and tandem gait are normal.   Motor Observation:    No asymmetry, no atrophy, and no involuntary movements noted. Tone:    Increased tone left > right  Posture:    Slightly flexed at the waist    Strength:    Strength is V/V in the upper and lower limbs.      Sensation: intact to LT     Reflex Exam:  DTR's:    Absent AJs otherwise 1+. Deep tendon reflexes in the upper and lower extremities are normal bilaterally.   Toes:    The toes are downgoing bilaterally.   Clonus:    Clonus is absent.    Assessment/Plan:   74 y.o. male here as requested by Prince Solian, MD for memory loss. PMHx short-term memory loss, HTN, HLD, Arthritis. He is parkinsonian on exam(hypomimia, cogwheeling, decreased arm swing and slight flexion at the waist), also reports loss of voice volume, illegible handwriting, action tremor,  and he has extensive history of parkinson's disease on his mother's side.   - MRI of the brain w/wo contrast for reversible causes of dementia: Showed nothing acute, generalized atrophy more apparent in the temporal areas with mild chronic microvascular ischemic changes. - Blood work today: B12 was in the low normal range but methylmalonic acid was normal, still I recommended increasing foods with B12 and monitoring long-term. - Formal memory testing - Dr. Sima Matas: Consistent with possible Alzheimer's with contributions from chronic microvascular changes and possibly alcohol use. - AT scan: Negative for parkinsonian etiology - Discussed alcohol use, 1-2 max a day - Here with his wife who provides much information. They found the formal memory testing helpful. We reviewed the results, discussed donepezil, reviewed imaging with patient and wife, discussed FDG PET Testing and  referral to [facility] which they have requested. Answered all questions. Will see if [facility] has any research trials  or anything else to add to diagnosis, they requested academic memory clinic evaluation. - Emailed his cardiologist about Donepezil due to low HR will await response  Orders Placed This Encounter  Procedures   Ambulatory referral to Neurology   No orders of the defined types were placed in this encounter.   Cc: Avva, Ravisankar, MD   A total of 40 minutes was spent on this patient's care, reviewing imaging, past records, recent hospitalization notes and results. Over half this time was spent on counseling patient on the  1. Mild cognitive impairment with memory loss   2. Memory loss    diagnosis and different diagnostic and therapeutic options, counseling and coordination of care, risks and benefitsof management, compliance, or risk factor reduction and education.     Sarina Ill, MD  [facility] [address] [place], [place] [address]  Phone [phone] Fax [phone]

## 2020-02-22 ENCOUNTER — Ambulatory Visit (INDEPENDENT_AMBULATORY_CARE_PROVIDER_SITE_OTHER): Payer: Medicare Other | Admitting: Neurology

## 2020-02-22 ENCOUNTER — Encounter: Payer: Self-pay | Admitting: Neurology

## 2020-02-22 VITALS — BP 118/68 | HR 62 | Ht 70.0 in | Wt 181.0 lb

## 2020-02-22 DIAGNOSIS — G3184 Mild cognitive impairment, so stated: Secondary | ICD-10-CM | POA: Insufficient documentation

## 2020-02-22 DIAGNOSIS — R413 Other amnesia: Secondary | ICD-10-CM | POA: Diagnosis not present

## 2020-02-22 NOTE — Telephone Encounter (Signed)
Spoke with patient who will come for BMET tomorrow or Friday  Updated readings sent to MD

## 2020-02-22 NOTE — Telephone Encounter (Signed)
Left message to call back  

## 2020-02-22 NOTE — Patient Instructions (Addendum)
Donepezil - Have emailed cardiology will let you know FDG PET Scan - See information [facility] referral  Donepezil tablets What is this medicine? DONEPEZIL (doe NEP e zil) is used to treat mild to moderate dementia caused by Alzheimer's disease. This medicine may be used for other purposes; ask your health care provider or pharmacist if you have questions. COMMON BRAND NAME(S): Aricept What should I tell my health care provider before I take this medicine? They need to know if you have any of these conditions:  asthma or other lung disease  difficulty passing urine  head injury  heart disease  history of irregular heartbeat  liver disease  seizures (convulsions)  stomach or intestinal disease, ulcers or stomach bleeding  an unusual or allergic reaction to donepezil, other medicines, foods, dyes, or preservatives  pregnant or trying to get pregnant  breast-feeding How should I use this medicine? Take this medicine by mouth with a glass of water. Follow the directions on the prescription label. You may take this medicine with or without food. Take this medicine at regular intervals. This medicine is usually taken before bedtime. Do not take it more often than directed. Continue to take your medicine even if you feel better. Do not stop taking except on your doctor's advice. If you are taking the 23 mg donepezil tablet, swallow it whole; do not cut, crush, or chew it. Talk to your pediatrician regarding the use of this medicine in children. Special care may be needed. Overdosage: If you think you have taken too much of this medicine contact a poison control center or emergency room at once. NOTE: This medicine is only for you. Do not share this medicine with others. What if I miss a dose? If you miss a dose, take it as soon as you can. If it is almost time for your next dose, take only that dose, do not take double or extra doses. What may interact with this medicine? Do not  take this medicine with any of the following medications:  certain medicines for fungal infections like itraconazole, fluconazole, posaconazole, and voriconazole  cisapride  dextromethorphan; quinidine  dronedarone  pimozide  quinidine  thioridazine This medicine may also interact with the following medications:  antihistamines for allergy, cough and cold  atropine  bethanechol  carbamazepine  certain medicines for bladder problems like oxybutynin, tolterodine  certain medicines for Parkinson's disease like benztropine, trihexyphenidyl  certain medicines for stomach problems like dicyclomine, hyoscyamine  certain medicines for travel sickness like scopolamine  dexamethasone  dofetilide  ipratropium  NSAIDs, medicines for pain and inflammation, like ibuprofen or naproxen  other medicines for Alzheimer's disease  other medicines that prolong the QT interval (cause an abnormal heart rhythm)  phenobarbital  phenytoin  rifampin, rifabutin or rifapentine  ziprasidone This list may not describe all possible interactions. Give your health care provider a list of all the medicines, herbs, non-prescription drugs, or dietary supplements you use. Also tell them if you smoke, drink alcohol, or use illegal drugs. Some items may interact with your medicine. What should I watch for while using this medicine? Visit your doctor or health care professional for regular checks on your progress. Check with your doctor or health care professional if your symptoms do not get better or if they get worse. You may get drowsy or dizzy. Do not drive, use machinery, or do anything that needs mental alertness until you know how this drug affects you. What side effects may I notice from receiving this  medicine? Side effects that you should report to your doctor or health care professional as soon as possible:  allergic reactions like skin rash, itching or hives, swelling of the face, lips,  or tongue  feeling faint or lightheaded, falls  loss of bladder control  seizures  signs and symptoms of a dangerous change in heartbeat or heart rhythm like chest pain; dizziness; fast or irregular heartbeat; palpitations; feeling faint or lightheaded, falls; breathing problems  signs and symptoms of infection like fever or chills; cough; sore throat; pain or trouble passing urine  signs and symptoms of liver injury like dark yellow or brown urine; general ill feeling or flu-like symptoms; light-colored stools; loss of appetite; nausea; right upper belly pain; unusually weak or tired; yellowing of the eyes or skin  slow heartbeat or palpitations  unusual bleeding or bruising  vomiting Side effects that usually do not require medical attention (report to your doctor or health care professional if they continue or are bothersome):  diarrhea, especially when starting treatment  headache  loss of appetite  muscle cramps  nausea  stomach upset This list may not describe all possible side effects. Call your doctor for medical advice about side effects. You may report side effects to FDA at 1-800-FDA-1088. Where should I keep my medicine? Keep out of reach of children. Store at room temperature between 15 and 30 degrees C (59 and 86 degrees F). Throw away any unused medicine after the expiration date. NOTE: This sheet is a summary. It may not cover all possible information. If you have questions about this medicine, talk to your doctor, pharmacist, or health care provider.  2020 Elsevier/Gold Standard (2018-06-21 10:33:41)   Mild Neurocognitive Disorder Mild neurocognitive disorder, formerly known as mild cognitive impairment, is a disorder in which memory does not work as well as it should. This disorder may also cause problems with other mental functions, including thought, communication, behavior, and completion of tasks. These problems can be noticed and measured, but they  usually do not interfere with daily activities or the ability to live independently. Mild neurocognitive disorder typically develops after 73 years of age, but it can also develop at younger ages. It is not as serious as major neurocognitive disorder, formerly known as dementia, but it may be the first sign of it. Generally, symptoms of this condition get worse over time. In rare cases, symptoms can get better. What are the causes? This condition may be caused by:  Brain disorders like Alzheimer's disease, Parkinson's disease, and other conditions that gradually damage nerve cells (neurodegenerative conditions).  Diseases that affect blood vessels in the brain and result in small strokes.  Certain infections, such as HIV.  Traumatic brain injury.  Other medical conditions, such as brain tumors, underactive thyroid (hypothyroidism), and vitamin B12 deficiency.  Use of certain drugs or prescription medicines. What increases the risk? The following factors may make you more likely to develop this condition:  Being older than 52.  Being male.  Low education level.  Diabetes, high blood pressure, high cholesterol, and other conditions that increase the risk for blood vessel diseases.  Untreated or undertreated sleep apnea.  Having a certain type of gene that can be passed from parent to child (inherited).  Chronic health problems such as heart disease, lung disease, liver disease, kidney disease, or depression. What are the signs or symptoms? Symptoms of this condition include:  Difficulty remembering. You may: ? Forget names, phone numbers, or details of recent events. ? Forget social  events and appointments. ? Repeatedly forget where you put your car keys or other items.  Difficulty thinking and solving problems. You may have trouble with complex tasks, such as: ? Paying bills. ? Driving in unfamiliar places.  Difficulty communicating. You may have trouble: ? Finding the  right word or naming an object. ? Forming a sentence that makes sense, or understanding what you read or hear.  Changes in your behavior or personality. When this happens, you may: ? Lose interest in the things that you used to enjoy. ? Withdraw from social situations. ? Get angry more easily than usual. ? Act before thinking. How is this diagnosed? This condition is diagnosed based on:  Your symptoms. Your health care provider may ask you and the people you spend time with, such as family and friends, about your symptoms.  Evaluation of mental functions (neuropsychological testing). Your health care provider may refer you to a neurologist or mental health specialist to evaluate your mental functions in detail. To identify the cause of your condition, your health care provider may:  Get a detailed medical history.  Ask about use of alcohol, drugs, and prescription medicines.  Do a physical exam.  Order blood tests and brain imaging exams. How is this treated? Mild neurocognitive disorder that is caused by medicine use, drug use, infection, or another medical condition may improve when the cause is treated, or when medicines or drugs are stopped. If this disorder has another cause, it generally does not improve and may get worse. In these cases, the goal of treatment is to help you manage the loss of mental function. Treatments in these cases include:  Medicine. Medicine mainly helps memory and behavior symptoms.  Talk therapy. Talk therapy provides education, emotional support, memory aids, and other ways of making up for problems with mental function.  Lifestyle changes, including: ? Getting regular exercise. ? Eating a healthy diet that includes omega-3 fatty acids. ? Challenging your thinking and memory skills. ? Having more social interaction. Follow these instructions at home: Eating and drinking   Drink enough fluid to keep your urine pale yellow.  Eat a healthy diet  that includes omega-3 fatty acids. These can be found in: ? Fish. ? Nuts. ? Leafy vegetables. ? Vegetable oils.  If you drink alcohol: ? Limit how much you use to:  0-1 drink a day for women.  0-2 drinks a day for men. ? Be aware of how much alcohol is in your drink. In the [place] one drink equals one 12 oz bottle of beer (355 mL), one 5 oz glass of wine (148 mL), or one 1 oz glass of hard liquor (44 mL). Lifestyle   Get regular exercise as told by your health care provider.  Do not use any products that contain nicotine or tobacco, such as cigarettes, e-cigarettes, and chewing tobacco. If you need help quitting, ask your health care provider.  Practice ways to manage stress. If you need help managing stress, ask your health care provider.  Continue to have social interaction.  Keep your mind active with stimulating activities you enjoy, such as reading or playing games.  Make sure to get quality sleep. Follow these tips: ? Avoid napping during the day. ? Keep your sleeping area dark and cool. ? Avoid exercising during the few hours before you go to bed. ? Avoid caffeine products in the evening. General instructions  Take over-the-counter and prescription medicines only as told by your health care provider. Your health care  provider may recommend that you avoid taking medicines that can affect thinking, such as pain medicines or sleep medicines.  Work with your health care provider to find out what you need help with and what your safety needs are.  Keep all follow-up visits as told by your health care provider. This is important. Where to find more information  [employer] on Aging: http://kim-miller.com/ Contact a health care provider if:  You have any new symptoms. Get help right away if:  You develop new confusion or your confusion gets worse.  You act in ways that place you or your family in danger. Summary  Mild neurocognitive disorder is a disorder in which  memory does not work as well as it should.  Mild neurocognitive disorder can have many causes. It may be the first stage of dementia.  To manage your condition, get regular exercise, keep your mind active, get quality sleep, and eat a healthy diet. This information is not intended to replace advice given to you by your health care provider. Make sure you discuss any questions you have with your health care provider. Document Revised: 01/31/2019 Document Reviewed: 01/31/2019 Elsevier Patient Education  [place].

## 2020-02-22 NOTE — Telephone Encounter (Signed)
Follow up  Pt called back gave his another 1 week BP   02/16/2020 BP 123/74 HR 53  02/17/2020 BP 131/77 HR 55  02/18/2020 BP 137/72 HR 59  02/19/2020 BP 125/74 HR 50  02/20/2020 BP 134/80 HR 58  02/21/2020 BP 130/79 HR 47  02/22/2020 BP 126/78 HR 60

## 2020-02-23 ENCOUNTER — Other Ambulatory Visit: Payer: Self-pay

## 2020-02-23 DIAGNOSIS — I10 Essential (primary) hypertension: Secondary | ICD-10-CM

## 2020-02-23 LAB — BASIC METABOLIC PANEL
BUN/Creatinine Ratio: 17 (ref 10–24)
BUN: 18 mg/dL (ref 8–27)
CO2: 26 mmol/L (ref 20–29)
Calcium: 9.7 mg/dL (ref 8.6–10.2)
Chloride: 103 mmol/L (ref 96–106)
Creatinine, Ser: 1.06 mg/dL (ref 0.76–1.27)
GFR calc Af Amer: 81 mL/min/{1.73_m2} (ref >59)
GFR calc non Af Amer: 70 mL/min/{1.73_m2} (ref >59)
Glucose: 121 mg/dL — ABNORMAL HIGH (ref 65–99)
Potassium: 4.3 mmol/L (ref 3.5–5.2)
Sodium: 141 mmol/L (ref 134–144)

## 2020-02-24 NOTE — Telephone Encounter (Signed)
BMET looks normal, but his BP appears improved.  Would continue current meds for now

## 2020-02-27 DIAGNOSIS — R001 Bradycardia, unspecified: Secondary | ICD-10-CM | POA: Diagnosis not present

## 2020-02-28 ENCOUNTER — Telehealth: Payer: Self-pay | Admitting: Neurology

## 2020-02-28 ENCOUNTER — Other Ambulatory Visit: Payer: Self-pay | Admitting: *Deleted

## 2020-02-28 DIAGNOSIS — I712 Thoracic aortic aneurysm, without rupture, unspecified: Secondary | ICD-10-CM

## 2020-02-28 DIAGNOSIS — I429 Cardiomyopathy, unspecified: Secondary | ICD-10-CM

## 2020-02-28 NOTE — Telephone Encounter (Signed)
Called Patient and spoke to him and relayed he needed to pick up his MRI CD and take with him to [facility]

## 2020-02-29 ENCOUNTER — Telehealth: Payer: Self-pay | Admitting: Cardiology

## 2020-02-29 NOTE — Telephone Encounter (Signed)
Left message for patient to call and discuss scheduling Cardiac MRI and MRA chest ordered by Dr. Gardiner Rhyme

## 2020-03-02 ENCOUNTER — Encounter: Payer: Self-pay | Admitting: Cardiology

## 2020-03-02 NOTE — Telephone Encounter (Signed)
Spoke with patient regarding appointment for Cardiac MRI and MRA chest scheduled Wednesday 04/04/20 8:00 am at [facility]---arrival time is 7:30 am 1st floor admissions office---patient states he saw this information on My Chart.  I will mail information to him as well.

## 2020-03-20 DIAGNOSIS — G3184 Mild cognitive impairment, so stated: Secondary | ICD-10-CM | POA: Diagnosis not present

## 2020-03-20 DIAGNOSIS — R4189 Other symptoms and signs involving cognitive functions and awareness: Secondary | ICD-10-CM | POA: Diagnosis not present

## 2020-03-20 DIAGNOSIS — G608 Other hereditary and idiopathic neuropathies: Secondary | ICD-10-CM | POA: Diagnosis not present

## 2020-03-20 DIAGNOSIS — G600 Hereditary motor and sensory neuropathy: Secondary | ICD-10-CM | POA: Diagnosis not present

## 2020-03-20 DIAGNOSIS — R001 Bradycardia, unspecified: Secondary | ICD-10-CM | POA: Diagnosis not present

## 2020-04-02 ENCOUNTER — Telehealth (HOSPITAL_COMMUNITY): Payer: Self-pay | Admitting: Emergency Medicine

## 2020-04-02 DIAGNOSIS — I129 Hypertensive chronic kidney disease with stage 1 through stage 4 chronic kidney disease, or unspecified chronic kidney disease: Secondary | ICD-10-CM | POA: Diagnosis not present

## 2020-04-02 DIAGNOSIS — N182 Chronic kidney disease, stage 2 (mild): Secondary | ICD-10-CM | POA: Diagnosis not present

## 2020-04-02 DIAGNOSIS — R001 Bradycardia, unspecified: Secondary | ICD-10-CM | POA: Diagnosis not present

## 2020-04-02 NOTE — Telephone Encounter (Signed)
Reaching out to patient to offer assistance regarding upcoming cardiac imaging study; pt verbalizes understanding of appt date/time, parking situation and where to check in, pre-test NPO status and medications ordered, and verified current allergies; name and call back number provided for further questions should they arise °Jameria Bradway RN Navigator Cardiac Imaging °[facility] °[phone] office °[phone] cell ° ° °Pt denies implants, denies claustro °

## 2020-04-04 ENCOUNTER — Ambulatory Visit (HOSPITAL_COMMUNITY)
Admission: RE | Admit: 2020-04-04 | Discharge: 2020-04-04 | Disposition: A | Discharge status: Home / Self Care | Payer: Medicare Other | Source: Ambulatory Visit | Attending: Cardiology | Admitting: Cardiology

## 2020-04-04 ENCOUNTER — Other Ambulatory Visit: Payer: Self-pay

## 2020-04-04 DIAGNOSIS — I712 Thoracic aortic aneurysm, without rupture, unspecified: Secondary | ICD-10-CM

## 2020-04-04 DIAGNOSIS — I351 Nonrheumatic aortic (valve) insufficiency: Secondary | ICD-10-CM | POA: Diagnosis not present

## 2020-04-04 DIAGNOSIS — I429 Cardiomyopathy, unspecified: Secondary | ICD-10-CM | POA: Insufficient documentation

## 2020-04-04 DIAGNOSIS — I350 Nonrheumatic aortic (valve) stenosis: Secondary | ICD-10-CM | POA: Diagnosis not present

## 2020-04-04 DIAGNOSIS — I77819 Aortic ectasia, unspecified site: Secondary | ICD-10-CM | POA: Diagnosis not present

## 2020-04-04 DIAGNOSIS — I422 Other hypertrophic cardiomyopathy: Secondary | ICD-10-CM | POA: Diagnosis not present

## 2020-04-04 MED ORDER — GADOBUTROL 1 MMOL/ML IV SOLN
8.0000 mL | Freq: Once | INTRAVENOUS | Status: AC | PRN
  Administered 2020-04-04: 8 mL via INTRAVENOUS

## 2020-04-10 ENCOUNTER — Other Ambulatory Visit: Payer: Self-pay | Admitting: *Deleted

## 2020-04-10 DIAGNOSIS — I712 Thoracic aortic aneurysm, without rupture, unspecified: Secondary | ICD-10-CM

## 2020-04-14 DIAGNOSIS — Z23 Encounter for immunization: Secondary | ICD-10-CM | POA: Diagnosis not present

## 2020-04-18 NOTE — Progress Notes (Signed)
Cardiology Office Note:    Date:  04/19/2020   ID:  Albert Pierce, DOB 1945-10-05, MRN [id number]  PCP:  Prince Solian, MD  Cardiologist:  No primary care provider on file.  Electrophysiologist:  None   Referring MD: Prince Solian, MD   Chief Complaint  Patient presents with  . Hypertension    History of Present Illness:    Albert Pierce is a 74 y.o. male with a hx of hypertension, mild aortic stenosis who presents for follow-up.  He was referred by Caprice Beaver, PA for evaluation of bradycardia, initially seen on 01/25/2020.  He was noted at clinic visit to have sinus bradycardia with rate 46.  He brought a log of his BP/pulse with him, and pulse has been as low as 39 at home.  Current medications include atenolol 25 mg daily, amlodipine 10 mg daily, and telmisartan 80 mg daily.  He denies any lightheadedness, syncope, fatigue, chest pain, dyspnea, or palpitations.  States that he exercises by walking his dog about 4 times per week for 20 to 30 minutes.  At initial clinic on 01/25/2020, and Tylenol was discontinued.  Echocardiogram on 02/13/2020 showed normal biventricular function, moderate focal basal septal LVH, grade 1 diastolic dysfunction, mild aortic stenosis, mild dilatation of the ascending aorta measuring 3 mm.  Zio patch x3 days on 02/28/2020 showed no significant arrhythmia, average heart rate 60 bpm.  Cardiac MRI on 04/04/2020 showed asymmetric hypertrophy measuring up to 16 mm and basal septum (8 mm and posterior wall), meeting criteria for HCM but patient's AS and hypertension could also be causes of asymmetric hypertrophy; no LGE, LVEF 72%, RVEF 63%, mild AI (regurgitant fraction 14%), ascending aortic dilatation measuring 40 mm.  Since last clinic visit, he reports that he has been doing well.  He denies any lightheadedness, syncope, chest pain, dyspnea, palpitations, or lower extremity edema.  For exercise, he walks his dog, typically 30 to 45 minutes each day.  Denies  any exertional symptoms.  Past Medical History:  Diagnosis Date  . Appendicitis   . Arthritis   . Gout   . Hyperlipidemia   . Hypertension   . Short-term memory loss 2019   mild    Past Surgical History:  Procedure Laterality Date  . APPENDECTOMY     74 y.o.  . COLONOSCOPY    . EYE MUSCLE SURGERY    . UMBILICAL HERNIA REPAIR  2016   Dr Dalbert Batman    Current Medications: Current Meds  Medication Sig  . amLODipine (NORVASC) 5 MG tablet TAKE ONE TABLET BY MOUTH ONCE DAILY (Patient taking differently: Take 10 mg by mouth daily. Take 2 tablets Daily at night)  . aspirin 81 MG tablet Take 81 mg by mouth daily.    . colchicine 0.6 MG tablet Take 1 tablet (0.6 mg total) by mouth 3 (three) times daily as needed.  . fish oil-omega-3 fatty acids 1000 MG capsule Take 3,000 mg by mouth daily.   . rosuvastatin (CRESTOR) 10 MG tablet Take 10 mg by mouth daily.  . valsartan (DIOVAN) 320 MG tablet Take 1 tablet (320 mg total) by mouth daily.  . [DISCONTINUED] valsartan (DIOVAN) 320 MG tablet Take 1 tablet (320 mg total) by mouth daily.     Allergies:   Patient has no known allergies.   Social History   Socioeconomic History  . Marital status: Married    Spouse name: Not on file  . Number of children: 3  . Years of education: 64  .  Highest education level: Professional school degree (e.g., MD, DDS, DVM, JD)  Occupational History  . Not on file  Tobacco Use  . Smoking status: Never Smoker  . Smokeless tobacco: Never Used  Substance and Sexual Activity  . Alcohol use: Yes    Alcohol/week: 2.0 - 8.0 standard drinks    Types: 2 - 8 Standard drinks or equivalent per week    Comment: generally wine, occ beer or hard liquor   . Drug use: No  . Sexual activity: Not on file  Other Topics Concern  . Not on file  Social History Narrative   Lives at home with wife   Right handed   Caffeine: diet coke, 2-4 cups/day   Social Determinants of Health   Financial Resource Strain:   .  Difficulty of Paying Living Expenses: Not on file  Food Insecurity:   . Worried About Charity fundraiser in the Last Year: Not on file  . Ran Out of Food in the Last Year: Not on file  Transportation Needs:   . Lack of Transportation (Medical): Not on file  . Lack of Transportation (Non-Medical): Not on file  Physical Activity:   . Days of Exercise per Week: Not on file  . Minutes of Exercise per Session: Not on file  Stress:   . Feeling of Stress : Not on file  Social Connections:   . Frequency of Communication with Friends and Family: Not on file  . Frequency of Social Gatherings with Friends and Family: Not on file  . Attends Religious Services: Not on file  . Active Member of Clubs or Organizations: Not on file  . Attends Archivist Meetings: Not on file  . Marital Status: Not on file     Family History: The patient's family history includes Parkinson's disease in his maternal aunt, maternal uncle, and mother; Tuberculosis in his maternal grandfather and paternal grandfather. There is no history of Dementia or Alzheimer's disease.  ROS:   Please see the history of present illness.    All other systems reviewed and are negative.  EKGs/Labs/Other Studies Reviewed:    The following studies were reviewed today:  EKG:  EKG is ordered today.  The ekg ordered today demonstrates sinus rhythm, rate 51, left axis deviation, nonspecific T wave flattening  Recent Labs: 02/23/2020: BUN 18; Creatinine, Ser 1.06; Potassium 4.3; Sodium 141  Recent Lipid Panel    Component Value Date/Time   CHOL 164 02/14/2014 0923   TRIG 132.0 02/14/2014 0923   TRIG 264 (HH) 05/27/2006 0848   HDL 35.30 (L) 02/14/2014 0923   CHOLHDL 5 02/14/2014 0923   VLDL 26.4 02/14/2014 0923   LDLCALC 102 (H) 02/14/2014 0923   LDLDIRECT 79.1 04/17/2009 0819    Physical Exam:    VS:  BP 132/70   Pulse (!) 51   Ht 5\' 10"  (1.778 m)   Wt 179 lb 12.8 oz (81.6 kg)   BMI 25.80 kg/m     Wt Readings  from Last 3 Encounters:  04/19/20 179 lb 12.8 oz (81.6 kg)  02/22/20 181 lb (82.1 kg)  01/25/20 180 lb 9.6 oz (81.9 kg)     GEN:  in no acute distress HEENT: Normal NECK: No JVD; No carotid bruits LYMPHATICS: No lymphadenopathy CARDIAC: regular, bradycardic, no murmurs, rubs, gallops RESPIRATORY:  Clear to auscultation without rales, wheezing or rhonchi  ABDOMEN: Soft, non-tender, non-distended MUSCULOSKELETAL:  No edema; No deformity  SKIN: Warm and dry NEUROLOGIC:  Alert and oriented x  3 PSYCHIATRIC:  Normal affect   ASSESSMENT:    1. Bradycardia   2. Aortic valve stenosis, etiology of cardiac valve disease unspecified   3. LVH (left ventricular hypertrophy)   4. Essential hypertension   5. Aortic dilatation (HCC)    PLAN:    Sinus bradycardia: Heart rate as low as high 30s at home.  He appears asymptomatic.  Suspect bradycardia is secondary to atenolol use.  Discontinued atenolol. Zio patch after discontinuing atenolol shows no significant bradyarrhythmia, average heart rate 60 bpm  LVH :Echocardiogram on 02/13/2020 showed moderate focal basal septal LVH.  Cardiac MRI on 04/04/2020 showed asymmetric hypertrophy measuring up to 16 mm and basal septum (8 mm and posterior wall), meeting criteria for HCM.  However the patient's AS and hypertension could also be causes of asymmetric hypertrophy.  No LGE on MRI.  Hypertension: On amlodipine 10 mg daily and valsartan 320 mg daily.  Appears controlled.  Aortic stenosis: Echocardiogram on 02/13/2020 showed mild AS, mild AI. Mild AI on CMR 04/04/2020.  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  MRA on 04/04/2020 showed ascending aorta measured 40 mm -Repeat MRA in 1 year  RTC in 6 months  Medication Adjustments/Labs and Tests Ordered: Current medicines are reviewed at length with the patient today.  Concerns regarding medicines are outlined above.  Orders Placed This Encounter   Procedures  . EKG 12-Lead   Meds ordered this encounter  Medications  . valsartan (DIOVAN) 320 MG tablet    Sig: Take 1 tablet (320 mg total) by mouth daily.    Dispense:  90 tablet    Refill:  3    Patient Instructions  Medication Instructions:  Your physician recommends that you continue on your current medications as directed. Please refer to the Current Medication list given to you today.  *If you need a refill on your cardiac medications before your next appointment, please call your pharmacy*   Testing/Procedures: MRA chest/aorta in September 2022  Follow-Up: At [facility], you and your health needs are our priority.  As part of our continuing mission to provide you with exceptional heart care, we have created designated Provider Care Teams.  These Care Teams include your primary Cardiologist (physician) and Advanced Practice Providers (APPs -  Physician Assistants and Nurse Practitioners) who all work together to provide you with the care you need, when you need it.  We recommend signing up for the patient portal called "MyChart".  Sign up information is provided on this After Visit Summary.  MyChart is used to connect with patients for Virtual Visits (Telemedicine).  Patients are able to view lab/test results, encounter notes, upcoming appointments, etc.  Non-urgent messages can be sent to your provider as well.   To learn more about what you can do with MyChart, go to [email or web address].    Your next appointment:   6 month(s)  The format for your next appointment:   In Person  Provider:   Oswaldo Milian, MD        Signed, Donato Heinz, MD  04/19/2020 10:34 AM    [place]

## 2020-04-19 ENCOUNTER — Other Ambulatory Visit: Payer: Self-pay

## 2020-04-19 ENCOUNTER — Ambulatory Visit (INDEPENDENT_AMBULATORY_CARE_PROVIDER_SITE_OTHER): Payer: Medicare Other | Admitting: Cardiology

## 2020-04-19 ENCOUNTER — Encounter: Payer: Self-pay | Admitting: Cardiology

## 2020-04-19 VITALS — BP 132/70 | HR 51 | Ht 70.0 in | Wt 179.8 lb

## 2020-04-19 DIAGNOSIS — I517 Cardiomegaly: Secondary | ICD-10-CM | POA: Diagnosis not present

## 2020-04-19 DIAGNOSIS — I350 Nonrheumatic aortic (valve) stenosis: Secondary | ICD-10-CM | POA: Diagnosis not present

## 2020-04-19 DIAGNOSIS — R001 Bradycardia, unspecified: Secondary | ICD-10-CM

## 2020-04-19 DIAGNOSIS — I10 Essential (primary) hypertension: Secondary | ICD-10-CM

## 2020-04-19 DIAGNOSIS — I77819 Aortic ectasia, unspecified site: Secondary | ICD-10-CM | POA: Diagnosis not present

## 2020-04-19 MED ORDER — VALSARTAN 320 MG PO TABS: 320.0000 mg | ORAL_TABLET | Freq: Every day | ORAL | 3 refills | Status: DC

## 2020-04-19 NOTE — Patient Instructions (Signed)
Medication Instructions:  Your physician recommends that you continue on your current medications as directed. Please refer to the Current Medication list given to you today.  *If you need a refill on your cardiac medications before your next appointment, please call your pharmacy*   Testing/Procedures: MRA chest/aorta in September 2022  Follow-Up: At [facility], you and your health needs are our priority.  As part of our continuing mission to provide you with exceptional heart care, we have created designated Provider Care Teams.  These Care Teams include your primary Cardiologist (physician) and Advanced Practice Providers (APPs -  Physician Assistants and Nurse Practitioners) who all work together to provide you with the care you need, when you need it.  We recommend signing up for the patient portal called "MyChart".  Sign up information is provided on this After Visit Summary.  MyChart is used to connect with patients for Virtual Visits (Telemedicine).  Patients are able to view lab/test results, encounter notes, upcoming appointments, etc.  Non-urgent messages can be sent to your provider as well.   To learn more about what you can do with MyChart, go to [email or web address].    Your next appointment:   6 month(s)  The format for your next appointment:   In Person  Provider:   Oswaldo Milian, MD

## 2020-04-24 ENCOUNTER — Ambulatory Visit: Payer: Medicare Other | Admitting: Cardiology

## 2020-05-10 ENCOUNTER — Ambulatory Visit: Payer: Medicare Other | Admitting: Neurology

## 2020-05-10 DIAGNOSIS — Z23 Encounter for immunization: Secondary | ICD-10-CM | POA: Diagnosis not present

## 2020-05-28 DIAGNOSIS — L718 Other rosacea: Secondary | ICD-10-CM | POA: Diagnosis not present

## 2020-05-28 DIAGNOSIS — Z85820 Personal history of malignant melanoma of skin: Secondary | ICD-10-CM | POA: Diagnosis not present

## 2020-05-28 DIAGNOSIS — Z85828 Personal history of other malignant neoplasm of skin: Secondary | ICD-10-CM | POA: Diagnosis not present

## 2020-05-28 DIAGNOSIS — D0359 Melanoma in situ of other part of trunk: Secondary | ICD-10-CM | POA: Diagnosis not present

## 2020-05-28 DIAGNOSIS — D2262 Melanocytic nevi of left upper limb, including shoulder: Secondary | ICD-10-CM | POA: Diagnosis not present

## 2020-05-28 DIAGNOSIS — D485 Neoplasm of uncertain behavior of skin: Secondary | ICD-10-CM | POA: Diagnosis not present

## 2020-05-28 DIAGNOSIS — D225 Melanocytic nevi of trunk: Secondary | ICD-10-CM | POA: Diagnosis not present

## 2020-05-28 DIAGNOSIS — D2272 Melanocytic nevi of left lower limb, including hip: Secondary | ICD-10-CM | POA: Diagnosis not present

## 2020-05-28 DIAGNOSIS — D2261 Melanocytic nevi of right upper limb, including shoulder: Secondary | ICD-10-CM | POA: Diagnosis not present

## 2020-06-05 DIAGNOSIS — N182 Chronic kidney disease, stage 2 (mild): Secondary | ICD-10-CM | POA: Diagnosis not present

## 2020-06-05 DIAGNOSIS — I129 Hypertensive chronic kidney disease with stage 1 through stage 4 chronic kidney disease, or unspecified chronic kidney disease: Secondary | ICD-10-CM | POA: Diagnosis not present

## 2020-06-05 DIAGNOSIS — R001 Bradycardia, unspecified: Secondary | ICD-10-CM | POA: Diagnosis not present

## 2020-06-12 DIAGNOSIS — R413 Other amnesia: Secondary | ICD-10-CM | POA: Diagnosis not present

## 2020-06-12 DIAGNOSIS — G3184 Mild cognitive impairment, so stated: Secondary | ICD-10-CM | POA: Diagnosis not present

## 2020-06-13 DIAGNOSIS — Z85828 Personal history of other malignant neoplasm of skin: Secondary | ICD-10-CM | POA: Diagnosis not present

## 2020-06-13 DIAGNOSIS — D0359 Melanoma in situ of other part of trunk: Secondary | ICD-10-CM | POA: Diagnosis not present

## 2020-06-13 DIAGNOSIS — D485 Neoplasm of uncertain behavior of skin: Secondary | ICD-10-CM | POA: Diagnosis not present

## 2020-06-13 DIAGNOSIS — Z85820 Personal history of malignant melanoma of skin: Secondary | ICD-10-CM | POA: Diagnosis not present

## 2020-07-16 DIAGNOSIS — Z125 Encounter for screening for malignant neoplasm of prostate: Secondary | ICD-10-CM | POA: Diagnosis not present

## 2020-07-16 DIAGNOSIS — E785 Hyperlipidemia, unspecified: Secondary | ICD-10-CM | POA: Diagnosis not present

## 2020-07-16 DIAGNOSIS — M109 Gout, unspecified: Secondary | ICD-10-CM | POA: Diagnosis not present

## 2020-07-23 DIAGNOSIS — R634 Abnormal weight loss: Secondary | ICD-10-CM | POA: Diagnosis not present

## 2020-07-23 DIAGNOSIS — E785 Hyperlipidemia, unspecified: Secondary | ICD-10-CM | POA: Diagnosis not present

## 2020-07-23 DIAGNOSIS — I129 Hypertensive chronic kidney disease with stage 1 through stage 4 chronic kidney disease, or unspecified chronic kidney disease: Secondary | ICD-10-CM | POA: Diagnosis not present

## 2020-07-23 DIAGNOSIS — I350 Nonrheumatic aortic (valve) stenosis: Secondary | ICD-10-CM | POA: Diagnosis not present

## 2020-07-23 DIAGNOSIS — Z0000 Encounter for general adult medical examination without abnormal findings: Secondary | ICD-10-CM | POA: Diagnosis not present

## 2020-07-23 DIAGNOSIS — R413 Other amnesia: Secondary | ICD-10-CM | POA: Diagnosis not present

## 2020-07-23 DIAGNOSIS — H02409 Unspecified ptosis of unspecified eyelid: Secondary | ICD-10-CM | POA: Diagnosis not present

## 2020-07-23 DIAGNOSIS — N400 Enlarged prostate without lower urinary tract symptoms: Secondary | ICD-10-CM | POA: Diagnosis not present

## 2020-07-23 DIAGNOSIS — N183 Chronic kidney disease, stage 3 unspecified: Secondary | ICD-10-CM | POA: Diagnosis not present

## 2020-07-23 DIAGNOSIS — M1990 Unspecified osteoarthritis, unspecified site: Secondary | ICD-10-CM | POA: Diagnosis not present

## 2020-07-23 DIAGNOSIS — R001 Bradycardia, unspecified: Secondary | ICD-10-CM | POA: Diagnosis not present

## 2020-07-23 DIAGNOSIS — M109 Gout, unspecified: Secondary | ICD-10-CM | POA: Diagnosis not present

## 2020-08-03 ENCOUNTER — Telehealth: Payer: Self-pay

## 2020-08-03 NOTE — Telephone Encounter (Signed)
NOTES ON FILE FROM DR Steva Ready [facility], SENT REFERRAL TO SCHEDULING

## 2020-08-21 ENCOUNTER — Other Ambulatory Visit: Payer: Self-pay

## 2020-08-21 ENCOUNTER — Encounter: Payer: Medicare Other | Attending: Psychology | Admitting: Psychology

## 2020-08-21 ENCOUNTER — Encounter: Payer: Self-pay | Admitting: Psychology

## 2020-08-21 DIAGNOSIS — G3184 Mild cognitive impairment, so stated: Secondary | ICD-10-CM | POA: Insufficient documentation

## 2020-08-22 DIAGNOSIS — G3184 Mild cognitive impairment, so stated: Secondary | ICD-10-CM | POA: Diagnosis not present

## 2020-08-22 DIAGNOSIS — R0683 Snoring: Secondary | ICD-10-CM | POA: Diagnosis not present

## 2020-08-22 DIAGNOSIS — G4733 Obstructive sleep apnea (adult) (pediatric): Secondary | ICD-10-CM | POA: Diagnosis not present

## 2020-08-23 NOTE — Progress Notes (Addendum)
NEUROBEHAVIORAL STATUS EXAM   Name: Albert Pierce Date of Birth: 1945-11-24 Date of Interview: 08/23/2020  Reason for Service:  Albert Pierce is a 75 y.o. male who presents for 72-month follow up neuropsychological evaluation due to initial diagnosis of mild neurocognitive disorder, amnestic type. The patient presents in person along with his wife for clinical interview to document any potential improvement or decline in cognitive functioning.   Sources of Information:  The following information was gathered from a follow up clinical interview with patient and wife and from a review of available medical records. The patient expressed understanding of the purpose of the evaluation and consented to all procedures.  History of Presenting Problem:   10/17/19: Albert Pierce is a 75 year old male referred by Dr. Jaynee Eagles with [facility] for neuropsychological evaluation due to progressing memory difficulties. The patient has a past medical history of short-term memory loss, hypertension, hyperlipidemia, arthritis. The patient reports that his mother and both of his siblings have been diagnosed with Parkinson's. The patient reports that his memory difficulties started 22 or 19 months ago and his wife reports that she started noticing changes in 2019 along with her 2 daughters. The patient and his wife reported that there have been occasional memory loss and by 2020 there were more symptoms and they started investigating what was happening and trying to figure out what could be done about his memory loss. There was a question about the patient's ability to perform ADLs but the patient is described as being fine handling everything with the exception of feeling less confident when in a car without GPS. The patient reports that he has been forgetting more things over the past few months. The patient's wife is reported that he has forgotten going places even when they went  for the entire weekend a year prior. The patient is described as repeating some statements multiple times in the same day. The patient is described as the patient looking at the same paperwork over and over but not following through or getting completed. This is described as issues like doing his taxes. The patient is described as having some tremor/ache in 1 hand. Tremor is described as being more with action versus resting tremor. His voice is getting softer and handwriting is becoming increasingly hard to read. No drooling or falls are noted. Smell and taste is described as impaired. The patient does drink a couple of large glasses of wine every day.  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 The patient describes any changes in expressive language functioning other than a little bit slower in his verbal production and a little bit more quiet in his pronunciation. Both the patient his wife deny any circumlocution or paraphasic-type errors. The patient described to is always been rather quiet and introverted and they deny any change in personality just a little bit more passivity. The patient is described as not looking to engage in activities himself. The patient describes himself as a good sleeper and that he may be sleeping a little bit longer than he has previously. He denies any change in his appetite.  08/21/20: During the clinical interview, the patient reports that he feels like his symptoms have gotten mildly worse over the past 9 months; self-rated decline 5%. He continues to have trouble remembering and recalling information of events that have happened recently and needs reminders. He uses a calender to keep track of medical appointments and a printed  list of current medications in wallet to review as needed. Patient currently manages his own medications and uses pill organizer for assistance. The patient reports that he has difficulty with remembering or recalling  people's names and has been told by his wife that he will ask the same question multiple times and repeats himself more than in past. He occasionally misplaces personal belongings/items.   The patient denies word finding difficulties but notes that his spontaneous speech is mildly reduced and delayed at times. He denies any significant changes in attention and ability to concentrate but admitted that he is "probably a little less focused" in the last year. The patient feels like his geographic orientation and visual-spatial abilities are still pretty good. He continues to drive with wife as passenger in familiar areas and denies problems; uses GPS for help as needed. Patient's wife is reportedly unconcerned about his ability to drive safely and denies any accidents or close calls.   He denies problems with balance and feels steady on his feet. He denies any lightheadedness or syncope. No falls in last year. No development of any tremors. Admits to having stooped posture and some rigidity on left shoulder/arm possibly neck as well. No visual changes. He describes his energy level as "good". Denies any visual/auditory hallucinations developing. He is not displaying any inappropriate or disinhibited behaviors. No personality changes reported. His mood is stable.   Patient currently sleeps around 8 hours per night and denies any neurovegetative symptoms. He continues to snore but less loud per wife's report; she believes recent weight loss has helped. Patient is scheduled for a sleep consult this week.    The patient's wife reports that she is noticing some worsening in the patient's memory but overall denies new concerns. She mentioned that patient is able to manage some aspects of their finances but requires help performing taxes.   Medical History: Past Medical History:  Diagnosis Date  . Appendicitis   . Arthritis   . Gout   . Hyperlipidemia   . Hypertension   . Short-term memory loss 2019   mild    Imaging:   MRI of the brain on 10/03/19 with contrast showed the following: 1.   Generalized cortical atrophy most pronounced in the medial temporal lobes. 2.   Some scattered T2/FLAIR hyperintense foci in the hemispheres most compatible with minimal chronic microvascular ischemic changes. 3.   There is a normal enhancement  SPECT (DaTscan) on 10/27/19 showed the following:  1. Symmetric activity in normal-shaped striata.  2. No evidence of loss of dopamine transport populations.  3. Findings are NOT typical of Parkinson's syndrome pathology.  MR cardiac morphology with and without contrast on 04/04/20 showed the following:  1. Asymmetric hypertrophy measuring up to 61mm in basal septum (91mm in posterior wall). 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 No late gadolinium enhancement to suggest myocardial scar. Normal ECV (26%, assuming Hct 40%) suggests no diffuse fibrosis 3.  Normal LV size with hyperdynamic systolic function (EF 62%) 4.  Normal RV size and systolic function (EF 22%) 5.  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 Mild aortic valve regurgitation (regurgitant fraction 14%). Aortic stenosis is present but gradient was not measured at aortic valve to assess stenosis severity  Current Medications:  Outpatient Encounter Medications as of 08/21/2020  Medication Sig  . amLODipine (NORVASC) 5 MG tablet TAKE ONE TABLET BY MOUTH ONCE DAILY (  Patient taking differently: Take 10 mg by mouth daily. Take 2 tablets Daily at night)  . aspirin 81 MG tablet Take 81 mg by mouth daily.    . colchicine 0.6 MG tablet Take 1 tablet (0.6 mg total) by mouth 3 (three) times daily as needed.  . fish oil-omega-3 fatty acids 1000 MG capsule Take 3,000 mg by mouth daily.   . rosuvastatin (CRESTOR) 10 MG tablet Take 10 mg by mouth daily.  . valsartan (DIOVAN) 320 MG  tablet Take 1 tablet (320 mg total) by mouth daily.   No facility-administered encounter medications on file as of 08/21/2020.   Behavioral Observations:   Appearance: Neatly, casually and appropriately dressed and groomed Gait: Ambulated independently, stooped and rigid posture. Slowed movements. Speech: Fluent; reduced rate, normal rhythm and slightly reduced volume. Minimal word finding difficulty. Thought process: Linear, goal directed and logical Mood/Affect: Euthymic, flattened  Interpersonal: Pleasant, reserved, appropriate behaviors.  90 minutes spent face-to-face with patient completing neurobehavioral status exam. 60 minutes spent integrating medical records/clinical data and completing this report. [id number] unit; [id number].  TESTING: There is medical necessity to proceed with neuropsychological assessment and repeat the previous battery of tests for direct comparisons. Results will be used to directly compare to baseline performances and assess for any progression or worsening in symptoms. This will help aid in differential diagnosis and clinical decision-making and to inform specific treatment recommendations. The patient's previous neuropsychological evaluation can be found in his EMR dated 11/15/2019 for convenience.  Clinical Decision Making: Repeat battery of tests completed during previous neuropsychological evaluation.   PLAN: The patient will return to repeat the testing battery completed on 11/11/19 with this provider. Education regarding testing procedures was provided to the patient. Subsequently, the patient will see this provider for a follow-up session at which time his test performances and my impressions and treatment recommendations will be reviewed in detail.   Evaluation ongoing; full report to follow.

## 2020-08-27 ENCOUNTER — Ambulatory Visit: Payer: Medicare Other | Admitting: Adult Health

## 2020-10-05 ENCOUNTER — Other Ambulatory Visit: Payer: Self-pay

## 2020-10-05 ENCOUNTER — Encounter: Payer: Self-pay | Admitting: Psychology

## 2020-10-05 ENCOUNTER — Encounter: Payer: Medicare Other | Attending: Psychology | Admitting: Psychology

## 2020-10-05 DIAGNOSIS — G20 Parkinson's disease: Secondary | ICD-10-CM | POA: Diagnosis not present

## 2020-10-05 DIAGNOSIS — G3184 Mild cognitive impairment, so stated: Secondary | ICD-10-CM | POA: Diagnosis not present

## 2020-10-05 NOTE — Progress Notes (Signed)
   Neuropsychology Note  BERKELEY VANAKEN completed 240 minutes of neuropsychological testing with this provider Health and safety inspector). The patient did not appear overtly distressed by the testing session, per behavioral observation or via self-report t Rest breaks were offered.   Clinical Decision Making: Repeat testing battery from baseline evaluation (11/2019). Changes were made as deemed necessary based on patient performance on testing, behavior observations, and additional pertinent factors.   Tests Administered:  Controlled Oral Word Association Test (COWAT; FAS and Animals)  [place] (BNT)  Trail Making Test (TMT)  Wechsler Adult Intelligence Scale, 4th Edition (WAIS-IV)  Wechsler Memory Scale, 4th Edition (WMS-IV); Older Adult Battery   BUTCH OTTERSON will return on 10/31/20 for an interactive feedback session with this provider at which time his test performances, clinical impressions and treatment recommendations will be reviewed in detail. The patient understands he can contact our office should he require our assistance before this time.  Full report to follow.

## 2020-10-09 ENCOUNTER — Other Ambulatory Visit: Payer: Self-pay

## 2020-10-09 ENCOUNTER — Encounter (HOSPITAL_BASED_OUTPATIENT_CLINIC_OR_DEPARTMENT_OTHER): Payer: Medicare Other | Admitting: Psychology

## 2020-10-09 DIAGNOSIS — G20 Parkinson's disease: Secondary | ICD-10-CM | POA: Diagnosis not present

## 2020-10-09 DIAGNOSIS — H02402 Unspecified ptosis of left eyelid: Secondary | ICD-10-CM

## 2020-10-09 DIAGNOSIS — G3184 Mild cognitive impairment, so stated: Secondary | ICD-10-CM | POA: Diagnosis not present

## 2020-10-11 ENCOUNTER — Encounter: Payer: Self-pay | Admitting: Psychology

## 2020-10-11 NOTE — Progress Notes (Signed)
NEUROPSYCHOLOGICAL EVALUATION   Name:    Albert Pierce  Date of Birth:   11-25-1945 Date of Interview:  08/21/20 Date of Testing:  10/05/20   Date of Feedback:  10/31/20      Background Information:  Reason for Referral:  Albert Pierce is a 75 y.o. male originally referred by Dr. Jaynee Eagles circa 08/2019 to assess his level of cognitive functioning and assist in differential diagnosis. The current evaluation consisted of a review of available medical records, an interview with the patient and wife, and repeat neuropsychological testing. Informed consent was obtained.  History of Presenting Problem:  Albert Pierce is a 75 year old male originally referred by Dr. Jaynee Eagles with [facility] for neuropsychological evaluation due to progressing memory difficulties.  The patient has a past medical history of short-term memory loss, hypertension, hyperlipidemia, arthritis.  The patient reports that his mother and both of his siblings have been diagnosed with Parkinson's.  The patient reports that his memory difficulties started 82 or 19 months ago and his wife reports that she started noticing changes in 2019 along with her 2 daughters.  The patient and his wife reported that there have been occasional memory loss and by 2020 there were more symptoms and they started investigating what was happening and trying to figure out what could be done about his memory loss.  There was a question about the patient's ability to perform ADLs but the patient is described as being fine handling everything with the exception of feeling less confident when in a car without GPS.  The patient reports that he has been forgetting more things over the past few months.  The patient's wife is reported that he has forgotten going places even when they went for the entire weekend a year prior.  The patient is described as repeating some statements multiple times in the same day.  The patient is described as the  patient looking at the same paperwork over and over but not following through or getting completed.  This is described as issues like doing his taxes.  The patient is described as having some tremor/ache in 1 hand.  Tremor is described as being more with action versus resting tremor.  His voice is getting softer and handwriting is becoming increasingly hard to read.  No drooling or falls are noted.  Smell and taste is described as impaired.  The patient does drink a couple of large glasses of wine every other day.  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  The patient describes any changes in expressive language functioning other than a little bit slower in his verbal production and a little bit more quiet in his pronunciation.  Both the patient his wife deny any circumlocution or paraphasic-type errors.  The patient described to is always been rather quiet and introverted and they deny any change in personality just a little bit more passivity.  The patient is described as not looking to engage in activities himself.  The patient describes himself as a good sleeper and that he may be sleeping a little bit longer than he has previously.  He denies any change in his appetite.  Clinical Interview (08/21/20): During the clinical interview, the patient reports that he feels like his symptoms have gotten mildly worse over the past 9 months; self-rated decline 5%. He continues to have trouble remembering and recalling information of events that have happened recently and needs reminders. He uses a calender to  keep track of medical appointments and a printed list of current medications in wallet to review as needed. Patient currently manages his own medications and uses pill organizer for assistance. The patient reports that he has difficulty with remembering or recalling people's names and has been told by his wife that he will ask the same question multiple times and repeats himself more than in  past. He occasionally misplaces personal belongings/items.   The patient denies word finding difficulties but notes that his spontaneous speech is mildly reduced and delayed at times. He denies any significant changes in attention and ability to concentrate but admitted that he is "probably a little less focused" in the last year. The patient feels like his geographic orientation and visual-spatial abilities are still pretty good. He continues to drive with wife as passenger in familiar areas and denies problems; uses GPS for help as needed. Patient's wife is reportedly unconcerned about his ability to drive safely and denies any accidents or close calls.   He denies problems with balance and feels steady on his feet. He denies any lightheadedness or syncope. No falls in last year. No development of any tremors. Admits to having stooped posture and some rigidity on left shoulder/arm possibly neck as well. No visual changes. He describes his energy level as "good". Denies any visual/auditory hallucinations developing. He is not displaying any inappropriate or disinhibited behaviors. No personality changes reported. His mood is stable.   Patient currently sleeps around 8 hours per night and denies any neurovegetative symptoms. He continues to snore but less loud per wife's report; she believes recent weight loss has helped. Patient is scheduled for a sleep consult this week.   The patient's wife reports that she is noticing some worsening in the patient's memory but overall denies new concerns. She mentioned that patient is able to manage some aspects of their finances but requires help performing taxes.   Medical History:  Past Medical History:  Diagnosis Date  . Appendicitis   . Arthritis   . Gout   . Hyperlipidemia   . Hypertension   . Short-term memory loss 2019   mild   Imaging:   MRI of the brain on 10/03/19 with contrast showed the following: 1. Generalized cortical atrophy most  pronounced in the medial temporal lobes. 2. Some scattered T2/FLAIR hyperintense foci in the hemispheres most compatible with minimal chronic microvascular ischemic changes. 3. There is a normal enhancement  SPECT (DaTscan) on 10/27/19 showed the following:  1. Symmetric activity in normal-shaped striata.  2. No evidence of loss of dopamine transport populations.  3. Findings are NOT typical of Parkinson's syndrome pathology.  MR cardiac morphology with and without contrast on 04/04/20 showed the following:  1. Asymmetric hypertrophy measuring up to 73mm in basal septum (86mm in posterior wall). 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 No late gadolinium enhancement to suggest myocardial scar. Normal ECV (26%, assuming Hct 40%) suggests no diffuse fibrosis 3. Normal LV size with hyperdynamic systolic function (EF 72%) 4. Normal RV size and systolic function (EF 09%) 5. 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 Mild aortic valve regurgitation (regurgitant fraction 14%). Aortic stenosis is present but gradient was not measured at aortic valve to assess stenosis severity  Current medications:  Outpatient Encounter Medications as of 10/09/2020  Medication Sig  . amLODipine (NORVASC) 5 MG tablet TAKE ONE TABLET BY MOUTH ONCE DAILY (Patient  taking differently: Take 10 mg by mouth daily. Take 2 tablets Daily at night)  . aspirin 81 MG tablet Take 81 mg by mouth daily.    . colchicine 0.6 MG tablet Take 1 tablet (0.6 mg total) by mouth 3 (three) times daily as needed.  . fish oil-omega-3 fatty acids 1000 MG capsule Take 3,000 mg by mouth daily.   . rosuvastatin (CRESTOR) 10 MG tablet Take 10 mg by mouth daily.  . valsartan (DIOVAN) 320 MG tablet Take 1 tablet (320 mg total) by mouth daily.   No facility-administered encounter medications on file as of  10/09/2020.   Current Examination:  Behavioral Observations:   Appearance: Neatly, casually and appropriately dressed and groomed. Ptosis on left side.   Gait: Ambulated independently, stooped and rigid posture. Slowed movements.Tremoulous at times.  Speech: Fluent; reduced rate, normal rhythm and slightly reduced volume. Mild word finding difficulty. Thought process: Linear, goal directed and logical Mood/Affect: Euthymic, flattened  Interpersonal: Pleasant, reserved, appropriate behaviors.  Orientation: Oriented to all spheres. Accurately named the current President and his predecessor.   The patient did not appear to have difficulty seeing, hearing, or understanding test instructions. He was inattentive.  lost instructional set at times and required questions and/or items to be repeated often.  He exhibited adequate distress tolerance on questions he did not know or tasks that were more difficult. He was cooperative with all assigned tasks.   Tests Administered (2021)  [employer] Test (BNT)  Controlled Oral Word Association Test (COWAT; FAS and Animals)  Grooved Pegboard  Solicitor Test (HVOT)  Modified-[place] Card Sorting Test (M-WCST)  Trail Making Test (TMT; Part A & B)   Wechsler Adult Intelligence Scale, 4th Edition (WAIS-IV)  Wechsler Memory Scale, 4th edition, Older Adult Battery (WMS-IV-OA)  Tests Administered (2022)  [place] Naming Test (BNT)  Controlled Oral Word Association Test (COWAT; FAS and Animals)  Trail Making Test (TMT; Part A & B)   Wechsler Adult Intelligence Scale, 4th Edition (WAIS-IV)  Wechsler Memory Scale, 4th edition, Older Adult Battery (WMS-IV-OA)  Results: Note: Standardized scores are presented only for use by appropriately trained professionals and to allow for any future test-retest comparison. These scores should not be interpreted without consideration of all the information that is contained in the rest of the  report. The most recent standardization samples from the test publisher or other sources were used whenever possible to derive standard scores; scores were corrected for age, gender, ethnicity and education when available.           Initially, we established a prediction of baseline/premorbid global cognitive and intellectual functioning. Based on work and educational history, premorbid function was estimated as above average to superior.   The patient produced a full-scale IQ score of 122 on previous testing, which falls at the 96th percentile and is in the superior range. This was consistent with predicted levels based on educational and occupational history and suggestive of preserved overall cognitive ability. He produced a similar score on the general abilities index (GAI=126), which also fell in the superior range compared to age matched peers. This measure places less emphasis on the most acutely impacted measures and this comprehensive battery of cognitive measure, notably working memory and information processing speed variables.   On current testing, the patient produced a full-scale IQ score of 117, which falls at the [place] percentile and is in the above average. This is slightly below previous performance but not statistically significant or reliable change over time.  He produced a higher score on the general abilities index (GAI=124), which fell in the superior range and more consistent with previous performance.   Composite Score Summary 2022  Scale Sum of Scaled Scores Composite Score Percentile Rank 95% Conf. Interval Qualitative Description  Verbal Comprehension 35 VCI 108 70 102-113 Average  Perceptual Reasoning 47 PRI 133 99 125-138 Very Superior  Working Memory 15 WMI 86 18 80-94 Low Average  Processing Speed 29 PSI 124 95 113-130 Superior  Full Scale 126 FSIQ 117 87 113-121 High Average  General Ability 82 GAI 124 95 118-128 Superior    Composite Score Summary (2021)    Scale Sum of Scaled Scores Composite Score Percentile Rank 95% Conf. Interval Qualitative Description  Verbal Comprehension 39 VCI 116 86 110-121 High Average  Perceptual Reasoning 45 PRI 129 97 121-134 Superior  Working Memory 21 WMI 102 55 95-109 Average  Processing Speed 27 PSI 120 91 110-126 Superior  Full Scale 132 FSIQ 122 93 117-126 Superior  General Ability 84 GAI 126 96 120-130 Superior   The patient produced a verbal comprehension index score of 116 (86th percentile) on previous testing, which was above average and generally consistent with predicted levels. Individual subtests were slightly variable and ranged from average to superior. He performed strong on measures of vocabulary and abstract verbal reasoning. General fund of knowledge was slightly weaker compared to overall comprehension.    Subtest 2021 Raw Score Scaled Score Percentile Rank  Similarities 32 15 95  Vocabulary 49 13 84  Information 17 11 63   On current testing, his verbal comprehension index score placed in the average range (VCI=108, 70th percentile), representing mild relative decline in the last year. Performance on an abstract reasoning task significantly declined whereas vocabulary and general fund of information remained stable.   Subtest 2022 Raw Score Scaled Score Percentile Rank  Similarities 26 11 63  Vocabulary 48 13 84  Information 17 11 63   During previous evaluation, the patient's perceptual reasoning index score was superior (PRI=129. 97th percentile) and commensurate with predicted levels of functioning. His pattern suggested visual spatial and visual constructional abilities were intact and relatively stronger than his verbal comprehension skills. There were no signs of visual-spatial inattention or visual perceptual defect. His ability to assemble two-dimensional block designs from models Product manager), solve visual spatial problems, and mentally reconstruct a puzzle using 3  separate pieces in a specified time limit was superior.   Subtest (2021) Raw Score Scaled Score Percentile Rank  Block Design 47 14 91  Matrix Reasoning 22 16 98  Visual Puzzles 17 15 95   Performance on these tasks slightly improved overall compared with previous testing. Visuoconstruction stayed the same while his ability to mentally rotate shapes and integrate them into a whole significantly improved. Responses to a non-verbal reasoning task was consistent overall with previous attempt; 1 raw score difference.  Subtest (2022) Raw Score Scaled Score Percentile Rank  Block Design 45 14 91  Matrix Reasoning 21 15 95  Visual Puzzles 21 18 99.6   2021    Raw  T-Score Percentile Description  HVOT  Total  27  47  37  Average  2022  Line Orientation (RBANS)  Total  18/20    51-75  Average  Spatial judgement was average. Performance on a clock drawing task was unremarkable with the exception of motor problems that made it hard to draw a circle or straight lines.    ATTENTION, WORKING MEMORY, PROCESSING SPEED, &  EXECUTIVE FUNCTIONING: On previous testing, patient produced a Working Memory Index score of 102 which fell at the 55th percentile and was average.This was slightly below predicted levels and suggestive for issues related to auditory encoding. The patient displayed some variability in this domain as he performed slightly worse on pure auditory encoding test (WAIS-IV Digit Span=37th percentile than a timed measure of basic arithmetic (WAIS-IV Arithmetic=75th percentile).  Subtest  Raw Score Scaled Score Percentile Rank  Digit Span 22 9 37  Arithmetic 15 12 75   During repeat testing, the patient produced a Working Memory Index Score of 86, which is in the 18th percentile and below average for his age. This is significantly below previous performance and predicted levels.  He displayed some variability in this domain as he performed worse on pure auditory encoding test (WAIS-IV Digit  Span=9th percentile than a timed measure of basic arithmetic (WAIS-IV Arithmetic=37th percentile). He lost instructional set on digits sequencing which explains the significant drop compared to previous testing.   Subtest Raw Score Scaled Score Percentile Rank  Digit Span 17 6 9   Arithmetic 12 9 37   The patient's auditory and visual encoding measures derived from both the WMS-IV (Older Adult Battery) as well as the WAIS-IVsuggest no significant difference between these abilities and that they appear to be relatively intact.   Working Counsellor Score Summary  Process Score Raw Score Scaled Score Percentile Rank Base Rate SEM  Digit Span Forward 9 9 37 -- 1.04  Digit Span Backward 7 9 37 -- 1.37  Digit Span Sequencing 1 2 0.4 -- 1.12  Longest Digit Span Forward 6 -- -- 68.0 --  Longest Digit Span Backward 4 -- -- 74.0 --  Longest Digit Span Sequence 3 -- -- 93.0 --   The patient produced a processing speed index score of 120 which fell at the 91st percentile and in the superior average range. This was consistent overall with predicted levels and did not suggest a clinically meaningful reduction in overall information processing speed, visual scanning and visual searching abilities.   Subtest 2022 Raw Score Scaled Score Percentile Rank  Symbol Search 36 15 95  Coding 70 14 91   Subtest 2021 Raw Score Scaled Score Percentile Rank  Symbol Search 31 13 84  Coding 70 14 91   Performance on a measure of visual scanning and processing speed was average on previous testing. When a switching component was added, his performance remained relatively the same, but placed above average without errors. The patient's ability to identify, maintain, and shift problem solving strategies in response to examiner feedback was well below average overall.   2021 TMT      Raw   T-Score Percentile Description  Part A    28"   55   69   Average   Part B    63"  57   75  Above Average *(Heaton et al.,  2004)    M-WCST      # Categories Correct   4   37   10    Low Average   # Perseverative Errors  9  38  12   Low Average   # Total Errors   18   41  18  Low Average  % Perseverative Responses   42  21  Low Average        Standard Score   Executive Function Composite   75   8  Well Below Average   2022  TMT      Raw  T-Score Percentile Description  Part A    26"  55   69  Average   Part B    50"  62   88  Above Average *(Heaton et al., 2004)    M-WCST      # Categories Correct   6   57  76    Above Average   # Perseverative Errors  9  54  12   Average   # Total Errors   18   57  18  Above Average  % Perseverative Responses   50  21  Average        Standard Score   Executive Function Composite   109   7  Average   On current testing, performance stayed the same on part A of this measure and then slightly improved when switching component was added; no errors committed. The patient's ability to identify, maintain, and shift problem solving strategies in response to examiner feedback was much better than previous performance and significantly improved from the 8th to 73rd percentile and placed average.   LANGUAGE: The tone, prosody, and fluency of the patient's speech were normal during both evaluations, with no clear errors or difficulty in speech; receptive language was intact. With regards to previous language testing, he showed relative strength in naming (BNT) and mild weakness in lexical fluency (COWAT=34th percentile) for his age and education, with improved performance on semantic fluency (Animal naming=88th percentile). He committed more errors and intrusions on lexical fluency than expected.   2021    Raw Score  T-Score  Percentile Description  BNT  Total (w/ [place]) 60/60   70  98  Superior   COWAT  FAS:   40   46  34  Average  Animals:  27   62  88  Above Average  2022     Raw Score  T-Score  Percentile Description  BNT  Total (w/[place]) 60/60   70  98  Superior    COWAT  FAS:   33   39  14  Average   Rep. 4  Animals:  20   49  46  Above Average   Rep. 4  LEARNING AND MEMORY: The patient was administered the Wechsler Memory Scale (4th Edition)- Older Adult Battery during both evaluations. Breaking the patient's memory down between auditory versus visual memory during previous testing, the patient produced an auditory memory index score of 90 which fell at the 25th percentile and was average overall.This was below expectation given predicted levels of historical functioning and suggestive for relative decline in auditory memory. His visual memory index score of 62 was extremely low range and significantly below expectation. Analysis of immediate versus delayed memory performance suggested that the patient was having clinically significant deficits with aspects of new learning and delayed visual memory. The patient produced an immediate memory index score of 93 which fell at the 32nd percentile and was in the average range. His delayed memory index score of 65 fell at the 1st percentile and placed in the extremely low range, which was significantly below predicted levels of intellectual functioning and immediate memory. To further analyze the patient's memory functions, we utilize the abilities-memory analysis.  This analysis takes the patient's general abilities index score from the Wechsler Adult Intelligence Scale (GA I: 126) and produces a predicted score based off that measure of where the patient should be performing on various memory indices.  This predicted score is then compared to his actual achieved scores on this battery.  On all memory components assessed the patient performed significantly worse than predicted scores based on his general abilities index score.  There were indications of specific memory deficits relative to premorbid estimations and historical functioning.   Index Score Summary 2021   Index Sum of Scaled Scores Index Score  Percentile Rank 95% Confidence Interval Qualitative Descriptor  Auditory Memory (AMI) 33 90 25 84-97 Average  Visual Memory (VMI) 7 62 1 58-68 Extremely Low  Immediate Memory (IMI) 27 93 32 87-100 Average  Delayed Memory (DMI) 13 65 1 60-75 Extremely Low   Predicted Difference Method 2021   Index Predicted WMS-IV Index Score Actual WMS-IV Index Score Difference Critical Value  Significant Difference Y/N Base Rate  Auditory Memory 114 90 24 10.41 Y 3%  Visual Memory 115 62 53 7.89 Y <1%  Immediate Memory 117 93 24 9.97 Y 2%  Delayed Memory 115 65 50 12.33 Y <1%  Statistical significance (critical value) at the .01 level.    On retesting, a clinically significant difference was found between immediate and delayed memory functioning. The patient produced an auditory memory index (AMI) score of 77, which falls at the 6th percentile and is in the borderline range. He performed similarly with regards to visual memory and produced a VMI of 76, which is is the 5th percentile.   Verbal memory: On a verbal learning and memory task, he was able to recall 15 word pairs across 4 trials and performed at the low end of the average range showing a flat learning curve. After a delay, the patient was able to freely recall 3 word pairs from the original list, which placed below average for his age. Performance on a recognition trial was below average.   On another verbal memory test, encoding and acquisition of contextual auditory information (i.e., short stories) was average. After a delay, free recall was exceptionally low. Performance on a yes/no recognition task was well below average. Overall, results of verbal memory testing were lower than previous testing and current overall cognitive ability.  Visual memory: His ability to immediately recall simple line drawings was average. After a delay, free recall was exceptionally low. Recognition was below average.   Index Score Summary 2022  Index Sum  of Scaled Scores Index Score Percentile Rank 95% Confidence Interval Qualitative Descriptor  Auditory Memory (AMI) 24 77 6 72-84 Borderline  Visual Memory (VMI) 11 76 5 72-82 Borderline  Immediate Memory (IMI) 27 93 32 87-100 Average  Delayed Memory (DMI) 8 54 0.1 50-65 Extremely Low   Primary Subtest Scaled Score Summary  Subtest Domain Raw Score Scaled Score Percentile Rank  Logical Memory I AM 28 9 37  Logical Memory II AM 0 1 0.1  Verbal Paired Associates I AM 15 8 25   Verbal Paired Associates II AM 3 6 9   Visual Reproduction I VM 31 10 50  Visual Reproduction II VM 0 1 0.1  Symbol Span VWM 14 9 37   Auditory Memory Process Score Summary  Process Score Raw Score Scaled Score Percentile Rank Cumulative Percentage (Base Rate)  LM II Recognition 13 - - 3-9%  VPA II Recognition 24 - - 10-16%   Visual Memory Process Score Summary  Process Score Raw Score Scaled Score Percentile Rank Cumulative Percentage (Base Rate)  VR II Recognition 3 - - 17-25%   Brief Cognitive Status Exam Classification 2022  Age Years of Education Raw Score  Classification Level Base Rate  74 years 4 months 19 44 Low 5.8   FINE MOTOR FUNCTIONING: Performance was exceptionally low and impaired for both right and left hand but right side was slightly worse and he dropped more (n=3).      Raw   T-Score Percentile  Description Grooved Pegboard  Dominant (Rt.): 173"   20  <1  Exceptionally Low    Drops:  3   Non-Dominant:  174"   29  2  Exceptionally Low    Drops:  0   Clinical Impressions: Results of the current neuropsychological evaluation continue to show significant memory deficits for both visual and auditory information that appear to be getting worse. Verbal reasoning and auditory working memory including complex attention and mental computation were significantly worse than previous testing, which provides additional support for this. Fine motor coordination was impaired bilaterally but slightly  worse on dominant side. Performance was intact for mostcognitive areas including aspects ofverbal comprehension, visuospatial and constructional abilities,andprocessing speed (including visual scanning and searching abilities), andvisualattention.Behaviorally, he showed some mild impulsivity and inattention. He demonstrated good distress tolerance.He displayed fair insight into his weaknessesand memorychallenges. In addition, he showed relatively good judgment when describing activities he thinks he can perform without harm and good reasoning about different safety situations.   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 He is currently driving and able to perform most activities of daily living independently. However, he does receive some support managing finances and uses compensatory strategies to manage medications and appointments, etc.   Potential etiologies continue to include a combination of the sequelae stemming from the early effects of an underlying neurodegenerative condition such as Alzheimer's disease and/or chronic microvascular changes. While the patient and wife report some issues with tremor they deny any visual hallucinations or delusional thinking. The pattern is not consistent with Lewy body dementia, Parkinson's, or other cortically or subcortically mediated progressive degenerative dementia processes. The patient and family members are encouraged to closely monitor the patient's cognitive and independent functioning over time. I will provide feedback to the patient regarding the results of this objective evaluation and review relevant recommendations.   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   Thank you for your referral of Albert Pierce. Please feel free to contact me if you have any questions or concerns regarding this report.

## 2020-10-16 DIAGNOSIS — G4733 Obstructive sleep apnea (adult) (pediatric): Secondary | ICD-10-CM | POA: Diagnosis not present

## 2020-10-16 DIAGNOSIS — R0683 Snoring: Secondary | ICD-10-CM | POA: Diagnosis not present

## 2020-10-16 DIAGNOSIS — G3184 Mild cognitive impairment, so stated: Secondary | ICD-10-CM | POA: Diagnosis not present

## 2020-10-17 DIAGNOSIS — G4733 Obstructive sleep apnea (adult) (pediatric): Secondary | ICD-10-CM | POA: Diagnosis not present

## 2020-10-30 DIAGNOSIS — R001 Bradycardia, unspecified: Secondary | ICD-10-CM | POA: Diagnosis not present

## 2020-10-30 DIAGNOSIS — G4733 Obstructive sleep apnea (adult) (pediatric): Secondary | ICD-10-CM | POA: Diagnosis not present

## 2020-10-30 DIAGNOSIS — G3184 Mild cognitive impairment, so stated: Secondary | ICD-10-CM | POA: Diagnosis not present

## 2020-10-31 ENCOUNTER — Other Ambulatory Visit: Payer: Self-pay

## 2020-10-31 ENCOUNTER — Encounter: Payer: Self-pay | Admitting: Psychology

## 2020-10-31 ENCOUNTER — Encounter: Payer: Medicare Other | Attending: Psychology | Admitting: Psychology

## 2020-10-31 DIAGNOSIS — G3184 Mild cognitive impairment, so stated: Secondary | ICD-10-CM

## 2020-10-31 DIAGNOSIS — H02402 Unspecified ptosis of left eyelid: Secondary | ICD-10-CM

## 2020-10-31 DIAGNOSIS — G20C Parkinsonism, unspecified: Secondary | ICD-10-CM

## 2020-10-31 DIAGNOSIS — G4733 Obstructive sleep apnea (adult) (pediatric): Secondary | ICD-10-CM | POA: Diagnosis not present

## 2020-10-31 DIAGNOSIS — G20 Parkinson's disease: Secondary | ICD-10-CM | POA: Diagnosis not present

## 2020-10-31 NOTE — Progress Notes (Signed)
Neuropsychology Feedback Appointment  Albert Pierce and wife returned for a feedback appointment today to review the results of his recent neuropsychological evaluation with this provider. 60 minutes face-to-face time was spent reviewing his test results, my impressions and my recommendations as detailed in his report (10/09/20 in EMR). Education was provided about MCI, Amnestic type and OSA. The patient and wife were given the opportunity to ask questions, and I did my best to answer these to their satisfaction. We scheduled a 9 month follow up appointment with plan to re-administer testing battery in 12 months.   According to Records, patient diagnosed with severe obstructive sleep apnea after recent study. Currently waiting for CPAP.    Patient had a follow up with Dr. Jeneen Rinks (Neurology) with [facility]. The following was copied from that visit.   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   Clinical Impressions: Results of the current neuropsychological evaluation continue to show significant memory deficits for both visual and auditory information that appear to be getting worse. Verbal reasoning and auditory working memory including complex attention and mental computation were significantly worse than previous testing, which provides additional support for this. Fine motor coordination was impaired bilaterally but slightly worse on dominant side. Performance was intact for mostcognitive areas including aspects ofverbal comprehension, visuospatial and constructional abilities,andprocessing speed (including visual scanning and searching abilities),  andvisualattention.Behaviorally, he showed some mild impulsivity and inattention. He demonstrated good distress tolerance.He displayed fair insight into his weaknessesand memorychallenges. In addition, he showed relatively good judgment when describing activities he thinks he can perform without harm and good reasoning about different safety situations.   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 He is currently driving and able to perform most activities of daily living independently. However, he does receive some support managing finances and uses compensatory strategies to manage medications and appointments, etc.   Potential etiologies continue to include a combination of the sequelae stemming from the early effects of an underlying neurodegenerative condition such as Alzheimer's disease and/or chronic microvascular changes (less likely). While the patient and wife report some issues with tremor and other parkinsonism features,  they deny any visual hallucinations or delusional thinking. The overall pattern appears inconsistent with Lewy body dementia, Parkinson's, or other cortically or subcortically mediated progressive degenerative dementia process at this time. Monitoring the course of the patient's cognitive, neurobehavioral, and motor symptoms over time should be informative regarding the relative contributions of the various factors in explaining the current clinical picture. Serial assessment will aid in developing optimal strategies for symptom management and enhancing quality of life.  Positive prognostic factors remain high cognitive reserve, intact cognitive and adaptive functioning, successful employment history, financial resources and housing, family support and involvement, social support, and effective interpersonal  skills.  The patient and family members are encouraged to closely monitor the patient's cognitive and independent functioning over time. I will provide feedback to the patient regarding the results of this objective evaluation and review relevant recommendations.   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

## 2020-11-13 ENCOUNTER — Ambulatory Visit: Payer: Medicare Other | Admitting: Cardiology

## 2020-12-10 NOTE — Progress Notes (Deleted)
Cardiology Office Note:    Date:  12/10/2020   ID:  Albert Pierce, DOB [birth year], MRN [id number]  PCP:  Prince Solian, MD  Cardiologist:  None  Electrophysiologist:  None   Referring MD: Prince Solian, MD   No chief complaint on file.   History of Present Illness:    Albert Pierce is a 75 y.o. male with a hx of hypertension, mild aortic stenosis who presents for follow-up.  He was referred by Caprice Beaver, PA for evaluation of bradycardia, initially seen on 01/25/2020.  He was noted at clinic visit to have sinus bradycardia with rate 46.  He brought a log of his BP/pulse with him, and pulse has been as low as 39 at home.  Current medications include atenolol 25 mg daily, amlodipine 10 mg daily, and telmisartan 80 mg daily.  He denies any lightheadedness, syncope, fatigue, chest pain, dyspnea, or palpitations.  States that he exercises by walking his dog about 4 times per week for 20 to 30 minutes.  At initial clinic on 01/25/2020, and Tylenol was discontinued.  Echocardiogram on 02/13/2020 showed normal biventricular function, moderate focal basal septal LVH, grade 1 diastolic dysfunction, mild aortic stenosis, mild dilatation of the ascending aorta measuring 3 mm.  Zio patch x3 days on 02/28/2020 showed no significant arrhythmia, average heart rate 60 bpm.  Cardiac MRI on 04/04/2020 showed asymmetric hypertrophy measuring up to 16 mm and basal septum (8 mm and posterior wall), meeting criteria for HCM but patient's AS and hypertension could also be causes of asymmetric hypertrophy; no LGE, LVEF 72%, RVEF 63%, mild AI (regurgitant fraction 14%), ascending aortic dilatation measuring 40 mm.  Since last clinic visit,  he reports that he has been doing well.  He denies any lightheadedness, syncope, chest pain, dyspnea, palpitations, or lower extremity edema.  For exercise, he walks his dog, typically 30 to 45 minutes each day.  Denies any exertional symptoms.  Past Medical History:   Diagnosis Date  . Appendicitis   . Arthritis   . Gout   . Hyperlipidemia   . Hypertension   . Short-term memory loss 2019   mild    Past Surgical History:  Procedure Laterality Date  . APPENDECTOMY     75 y.o.  . COLONOSCOPY    . EYE MUSCLE SURGERY    . UMBILICAL HERNIA REPAIR  2016   Dr Dalbert Batman    Current Medications: No outpatient medications have been marked as taking for the 12/12/20 encounter (Appointment) with Donato Heinz, MD.     Allergies:   Patient has no known allergies.   Social History   Socioeconomic History  . Marital status: Married    Spouse name: Not on file  . Number of children: 3  . Years of education: 96  . Highest education level: Professional school degree (e.g., MD, DDS, DVM, JD)  Occupational History  . Not on file  Tobacco Use  . Smoking status: Never Smoker  . Smokeless tobacco: Never Used  Substance and Sexual Activity  . Alcohol use: Yes    Alcohol/week: 2.0 - 8.0 standard drinks    Types: 2 - 8 Standard drinks or equivalent per week    Comment: generally wine, occ beer or hard liquor   . Drug use: No  . Sexual activity: Not on file  Other Topics Concern  . Not on file  Social History Narrative   Lives at home with wife   Right handed   Caffeine: diet coke, 2-4  cups/day   Social Determinants of Health   Financial Resource Strain: Not on file  Food Insecurity: Not on file  Transportation Needs: Not on file  Physical Activity: Not on file  Stress: Not on file  Social Connections: Not on file     Family History: The patient's family history includes Parkinson's disease in his maternal aunt, maternal uncle, and mother; Tuberculosis in his maternal grandfather and paternal grandfather. There is no history of Dementia or Alzheimer's disease.  ROS:   Please see the history of present illness.    All other systems reviewed and are negative.  EKGs/Labs/Other Studies Reviewed:    The following studies were reviewed  today:  EKG:  EKG is ordered today.  The ekg ordered today demonstrates sinus rhythm, rate 51, left axis deviation, nonspecific T wave flattening  Recent Labs: 02/23/2020: BUN 18; Creatinine, Ser 1.06; Potassium 4.3; Sodium 141  Recent Lipid Panel    Component Value Date/Time   CHOL 164 02/14/2014 0923   TRIG 132.0 02/14/2014 0923   TRIG 264 (HH) 05/27/2006 0848   HDL 35.30 (L) 02/14/2014 0923   CHOLHDL 5 02/14/2014 0923   VLDL 26.4 02/14/2014 0923   LDLCALC 102 (H) 02/14/2014 0923   LDLDIRECT 79.1 04/17/2009 0819    Physical Exam:    VS:  There were no vitals taken for this visit.    Wt Readings from Last 3 Encounters:  04/19/20 179 lb 12.8 oz (81.6 kg)  02/22/20 181 lb (82.1 kg)  01/25/20 180 lb 9.6 oz (81.9 kg)     GEN:  in no acute distress HEENT: Normal NECK: No JVD; No carotid bruits LYMPHATICS: No lymphadenopathy CARDIAC: regular, bradycardic, no murmurs, rubs, gallops RESPIRATORY:  Clear to auscultation without rales, wheezing or rhonchi  ABDOMEN: Soft, non-tender, non-distended MUSCULOSKELETAL:  No edema; No deformity  SKIN: Warm and dry NEUROLOGIC:  Alert and oriented x 3 PSYCHIATRIC:  Normal affect   ASSESSMENT:    No diagnosis found. PLAN:    Sinus bradycardia: Heart rate as low as high 30s at home.  He appears asymptomatic.  Suspect bradycardia is secondary to atenolol use.  Discontinued atenolol. Zio patch after discontinuing atenolol shows no significant bradyarrhythmia, average heart rate 60 bpm  LVH :Echocardiogram on 02/13/2020 showed moderate focal basal septal LVH.  Cardiac MRI on 04/04/2020 showed asymmetric hypertrophy measuring up to 16 mm and basal septum (8 mm and posterior wall), meeting criteria for HCM.  However the patient's AS and hypertension could also be causes of asymmetric hypertrophy.  No LGE on MRI.  Hypertension: On amlodipine 10 mg daily and valsartan 320 mg daily.  Appears controlled.  Aortic stenosis: Echocardiogram on 02/13/2020  showed mild AS, mild AI. Mild AI on CMR 04/04/2020.  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  MRA on 04/04/2020 showed ascending aorta measured 40 mm.  We will continue to monitor  Hyperlipidemia: On rosuvastatin 10 mg daily.  LDL 89 07/16/20.  Will check calcium score to guide how aggressive to be in lowering cholesterol.  OSA: Recently diagnosed with sleep apnea, starting on CPAP  RTC in 6 months  Medication Adjustments/Labs and Tests Ordered: Current medicines are reviewed at length with the patient today.  Concerns regarding medicines are outlined above.  No orders of the defined types were placed in this encounter.  No orders of the defined types were placed in this encounter.   There are no Patient Instructions on file  for this visit.   Signed, Donato Heinz, MD  12/10/2020 11:04 AM    [place]

## 2020-12-12 ENCOUNTER — Encounter: Payer: Self-pay | Admitting: Cardiology

## 2020-12-12 ENCOUNTER — Ambulatory Visit (INDEPENDENT_AMBULATORY_CARE_PROVIDER_SITE_OTHER): Payer: Medicare Other | Admitting: Cardiology

## 2020-12-12 ENCOUNTER — Other Ambulatory Visit: Payer: Self-pay

## 2020-12-12 VITALS — BP 128/66 | HR 51 | Ht 70.0 in | Wt 181.8 lb

## 2020-12-12 DIAGNOSIS — E785 Hyperlipidemia, unspecified: Secondary | ICD-10-CM | POA: Diagnosis not present

## 2020-12-12 DIAGNOSIS — I517 Cardiomegaly: Secondary | ICD-10-CM | POA: Diagnosis not present

## 2020-12-12 DIAGNOSIS — R001 Bradycardia, unspecified: Secondary | ICD-10-CM | POA: Diagnosis not present

## 2020-12-12 DIAGNOSIS — I10 Essential (primary) hypertension: Secondary | ICD-10-CM | POA: Diagnosis not present

## 2020-12-12 DIAGNOSIS — I77819 Aortic ectasia, unspecified site: Secondary | ICD-10-CM

## 2020-12-12 NOTE — Progress Notes (Signed)
Cardiology Office Note:    Date:  12/12/2020   ID:  Albert Pierce, DOB 1945/10/17, MRN [id number]  PCP:  Prince Solian, MD  Cardiologist:  None  Electrophysiologist:  None   Referring MD: Prince Solian, MD   Chief Complaint  Patient presents with  . Bradycardia    History of Present Illness:    Albert Pierce is a 75 y.o. male with a hx of hypertension, mild aortic stenosis who presents for follow-up.  He was referred by Caprice Beaver, PA for evaluation of bradycardia, initially seen on 01/25/2020.  He was noted at clinic visit to have sinus bradycardia with rate 46.  He brought a log of his BP/pulse with him, and pulse has been as low as 39 at home.  Current medications include atenolol 25 mg daily, amlodipine 10 mg daily, and telmisartan 80 mg daily.  He denies any lightheadedness, syncope, fatigue, chest pain, dyspnea, or palpitations.  States that he exercises by walking his dog about 4 times per week for 20 to 30 minutes.  At initial clinic on 01/25/2020, and Tylenol was discontinued.  Echocardiogram on 02/13/2020 showed normal biventricular function, moderate focal basal septal LVH, grade 1 diastolic dysfunction, mild aortic stenosis, mild dilatation of the ascending aorta measuring 3 mm.  Zio patch x3 days on 02/28/2020 showed no significant arrhythmia, average heart rate 60 bpm.  Cardiac MRI on 04/04/2020 showed asymmetric hypertrophy measuring up to 16 mm and basal septum (8 mm and posterior wall), meeting criteria for HCM but patient's AS and hypertension could also be causes of asymmetric hypertrophy; no LGE, LVEF 72%, RVEF 63%, mild AI (regurgitant fraction 14%), ascending aortic dilatation measuring 40 mm.  Today, he is doing well. He is accompanied by his wife who states that he was recently diagnosed with severe sleep apnea. Upon his recent diagnosis, he received a CPAP machine one week ago. He states that he participates in walking his dog daily for thirty to forty five  minutes and yard work for exercise. He denies any chest pain, palpitations, SOB, lightheadedness, syncope, or lower extremity edema.     Past Medical History:  Diagnosis Date  . Appendicitis   . Arthritis   . Gout   . Hyperlipidemia   . Hypertension   . Short-term memory loss 2019   mild    Past Surgical History:  Procedure Laterality Date  . APPENDECTOMY     75 y.o.  . COLONOSCOPY    . EYE MUSCLE SURGERY    . UMBILICAL HERNIA REPAIR  2016   Dr Dalbert Batman    Current Medications: Current Meds  Medication Sig  . amLODipine (NORVASC) 5 MG tablet Take 5 mg by mouth daily. Take 2 Tablets by mouth Daily  . aspirin 81 MG tablet Take 81 mg by mouth daily.  . colchicine 0.6 MG tablet Take 1 tablet (0.6 mg total) by mouth 3 (three) times daily as needed.  . donepezil (ARICEPT) 5 MG tablet Take 1 pill every morning after breakfast  . fish oil-omega-3 fatty acids 1000 MG capsule Take 3,000 mg by mouth daily.  . rosuvastatin (CRESTOR) 10 MG tablet Take 10 mg by mouth daily.  . Turmeric 500 MG TABS Take by mouth.  . valsartan (DIOVAN) 320 MG tablet Take 1 tablet (320 mg total) by mouth daily.     Allergies:   Patient has no known allergies.   Social History   Socioeconomic History  . Marital status: Married    Spouse name: Not on  file  . Number of children: 3  . Years of education: 33  . Highest education level: Professional school degree (e.g., MD, DDS, DVM, JD)  Occupational History  . Not on file  Tobacco Use  . Smoking status: Never Smoker  . Smokeless tobacco: Never Used  Substance and Sexual Activity  . Alcohol use: Yes    Alcohol/week: 2.0 - 8.0 standard drinks    Types: 2 - 8 Standard drinks or equivalent per week    Comment: generally wine, occ beer or hard liquor   . Drug use: No  . Sexual activity: Not on file  Other Topics Concern  . Not on file  Social History Narrative   Lives at home with wife   Right handed   Caffeine: diet coke, 2-4 cups/day   Social  Determinants of Health   Financial Resource Strain: Not on file  Food Insecurity: Not on file  Transportation Needs: Not on file  Physical Activity: Not on file  Stress: Not on file  Social Connections: Not on file     Family History: The patient's family history includes Parkinson's disease in his maternal aunt, maternal uncle, and mother; Tuberculosis in his maternal grandfather and paternal grandfather. There is no history of Dementia or Alzheimer's disease.  ROS:   Please see the history of present illness.    All other systems reviewed and are negative.  EKGs/Labs/Other Studies Reviewed:    The following studies were reviewed today:  EKG:  12/12/2020- The EKG ordered demonstrates sinus bradycardia, rate 51, Q waves in lead 1 and 2  04/19/2020- The EKG ordered demonstrates sinus rhythm, rate 51, left axis deviation, nonspecific T wave flattening  Recent Labs: 02/23/2020: BUN 18; Creatinine, Ser 1.06; Potassium 4.3; Sodium 141  Recent Lipid Panel    Component Value Date/Time   CHOL 164 02/14/2014 0923   TRIG 132.0 02/14/2014 0923   TRIG 264 (HH) 05/27/2006 0848   HDL 35.30 (L) 02/14/2014 0923   CHOLHDL 5 02/14/2014 0923   VLDL 26.4 02/14/2014 0923   LDLCALC 102 (H) 02/14/2014 0923   LDLDIRECT 79.1 04/17/2009 0819    Physical Exam:    VS:  BP 128/66   Pulse (!) 51   Ht 5\' 10"  (1.778 m)   Wt 181 lb 12.8 oz (82.5 kg)   SpO2 96%   BMI 26.09 kg/m     Wt Readings from Last 3 Encounters:  12/12/20 181 lb 12.8 oz (82.5 kg)  04/19/20 179 lb 12.8 oz (81.6 kg)  02/22/20 181 lb (82.1 kg)     GEN:  in no acute distress  HEENT: Normal NECK: No JVD; No carotid bruits LYMPHATICS: No lymphadenopathy CARDIAC: regular, bradycardic, 2/6 systolic murmur RESPIRATORY:  Clear to auscultation without rales, wheezing or rhonchi  ABDOMEN: Soft, non-tender, non-distended MUSCULOSKELETAL:  No edema; No deformity  SKIN: Warm and dry NEUROLOGIC:  Alert and oriented x  3 PSYCHIATRIC:  Normal affect   ASSESSMENT:    1. Bradycardia   2. LVH (left ventricular hypertrophy)   3. Essential hypertension   4. Hyperlipidemia, unspecified hyperlipidemia type   5. Aortic dilatation (HCC)    PLAN:    Sinus bradycardia: Heart rate as low as high 30s at home.  He appears asymptomatic.  Suspect bradycardia is secondary to atenolol use.  Discontinued atenolol. Zio patch after discontinuing atenolol shows no significant bradyarrhythmia, average heart rate 60 bpm  LVH :Echocardiogram on 02/13/2020 showed moderate focal basal septal LVH.  Cardiac MRI on 04/04/2020 showed asymmetric  hypertrophy measuring up to 16 mm and basal septum (8 mm and posterior wall), meeting criteria for HCM.  However the patient's AS and hypertension could also be causes of asymmetric hypertrophy.  No LGE on MRI.  Hypertension: On amlodipine 10 mg daily and valsartan 320 mg daily.  Appears controlled.  Aortic stenosis: Echocardiogram on 02/13/2020 showed mild AS, mild AI. Mild AI on CMR 04/04/2020.  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  MRA on 04/04/2020 showed ascending aorta measured 40 mm.  We will continue to monitor  Hyperlipidemia: On rosuvastatin 10 mg daily.  LDL 89 07/16/20.  Will check calcium score to guide how aggressive to be in lowering cholesterol.  OSA: Recently diagnosed with sleep apnea, starting on CPAP  RTC in 6 months  Medication Adjustments/Labs and Tests Ordered: Current medicines are reviewed at length with the patient today.  Concerns regarding medicines are outlined above.  Orders Placed This Encounter  Procedures  . CT CARDIAC SCORING (SELF PAY ONLY)  . EKG 12-Lead   No orders of the defined types were placed in this encounter.   Patient Instructions  Medication Instructions:  Your physician recommends that you continue on your current medications as directed. Please refer to the Current Medication list  given to you today.  *If you need a refill on your cardiac medications before your next appointment, please call your pharmacy*  Testing/Procedures: CT coronary calcium score. This test is done at [address]. This is $99 out of pocket.   Coronary CalciumScan A coronary calcium scan is an imaging test used to look for deposits of calcium and other fatty materials (plaques) in the inner lining of the blood vessels of the heart (coronary arteries). These deposits of calcium and plaques can partly clog and narrow the coronary arteries without producing any symptoms or warning signs. This puts a person at risk for a heart attack. This test can detect these deposits before symptoms develop. Tell a health care provider about:  Any allergies you have.  All medicines you are taking, including vitamins, herbs, eye drops, creams, and over-the-counter medicines.  Any problems you or family members have had with anesthetic medicines.  Any blood disorders you have.  Any surgeries you have had.  Any medical conditions you have.  Whether you are pregnant or may be pregnant. What are the risks? Generally, this is a safe procedure. However, problems may occur, including:  Harm to a pregnant woman and her unborn baby. This test involves the use of radiation. Radiation exposure can be dangerous to a pregnant woman and her unborn baby. If you are pregnant, you generally should not have this procedure done.  Slight increase in the risk of cancer. This is because of the radiation involved in the test. What happens before the procedure? No preparation is needed for this procedure. What happens during the procedure?  You will undress and remove any jewelry around your neck or chest.  You will put on a hospital gown.  Sticky electrodes will be placed on your chest. The electrodes will be connected to an electrocardiogram (ECG) machine to record a tracing of the electrical activity  of your heart.  A CT scanner will take pictures of your heart. During this time, you will be asked to lie still and hold your breath for 2-3 seconds while a picture of your heart is being taken. The procedure may vary among health care  providers and hospitals. What happens after the procedure?  You can get dressed.  You can return to your normal activities.  It is up to you to get the results of your test. Ask your health care provider, or the department that is doing the test, when your results will be ready. Summary  A coronary calcium scan is an imaging test used to look for deposits of calcium and other fatty materials (plaques) in the inner lining of the blood vessels of the heart (coronary arteries).  Generally, this is a safe procedure. Tell your health care provider if you are pregnant or may be pregnant.  No preparation is needed for this procedure.  A CT scanner will take pictures of your heart.  You can return to your normal activities after the scan is done. This information is not intended to replace advice given to you by your health care provider. Make sure you discuss any questions you have with your health care provider. Document Released: 12/27/2007 Document Revised: 05/19/2016 Document Reviewed: 05/19/2016 Elsevier Interactive Patient Education  2017 [place]: At [facility], you and your health needs are our priority.  As part of our continuing mission to provide you with exceptional heart care, we have created designated Provider Care Teams.  These Care Teams include your primary Cardiologist (physician) and Advanced Practice Providers (APPs -  Physician Assistants and Nurse Practitioners) who all work together to provide you with the care you need, when you need it.  We recommend signing up for the patient portal called "MyChart".  Sign up information is provided on this After Visit Summary.  MyChart is used to connect with patients for Virtual  Visits (Telemedicine).  Patients are able to view lab/test results, encounter notes, upcoming appointments, etc.  Non-urgent messages can be sent to your provider as well.   To learn more about what you can do with MyChart, go to [email or web address].    Your next appointment:   6 month(s)  The format for your next appointment:   In Person  Provider:   Oswaldo Milian, MD        Ella Bodo as a scribe for Donato Heinz, MD.,have documented all relevant documentation on the behalf of Donato Heinz, MD,as directed by  Donato Heinz, MD while in the presence of Donato Heinz, MD.  I, Donato Heinz, MD, have reviewed all documentation for this visit. The documentation on 12/12/20 for the exam, diagnosis, procedures, and orders are all accurate and complete.   Signed, Donato Heinz, MD  12/12/2020 8:30 AM    [facility]

## 2020-12-12 NOTE — Patient Instructions (Signed)
Medication Instructions:  Your physician recommends that you continue on your current medications as directed. Please refer to the Current Medication list given to you today.  *If you need a refill on your cardiac medications before your next appointment, please call your pharmacy*  Testing/Procedures: CT coronary calcium score. This test is done at [address]. This is $99 out of pocket.   Coronary CalciumScan A coronary calcium scan is an imaging test used to look for deposits of calcium and other fatty materials (plaques) in the inner lining of the blood vessels of the heart (coronary arteries). These deposits of calcium and plaques can partly clog and narrow the coronary arteries without producing any symptoms or warning signs. This puts a person at risk for a heart attack. This test can detect these deposits before symptoms develop. Tell a health care provider about: Any allergies you have. All medicines you are taking, including vitamins, herbs, eye drops, creams, and over-the-counter medicines. Any problems you or family members have had with anesthetic medicines. Any blood disorders you have. Any surgeries you have had. Any medical conditions you have. Whether you are pregnant or may be pregnant. What are the risks? Generally, this is a safe procedure. However, problems may occur, including: Harm to a pregnant woman and her unborn baby. This test involves the use of radiation. Radiation exposure can be dangerous to a pregnant woman and her unborn baby. If you are pregnant, you generally should not have this procedure done. Slight increase in the risk of cancer. This is because of the radiation involved in the test. What happens before the procedure? No preparation is needed for this procedure. What happens during the procedure? You will undress and remove any jewelry around your neck or chest. You will put on a hospital gown. Sticky electrodes will be placed on  your chest. The electrodes will be connected to an electrocardiogram (ECG) machine to record a tracing of the electrical activity of your heart. A CT scanner will take pictures of your heart. During this time, you will be asked to lie still and hold your breath for 2-3 seconds while a picture of your heart is being taken. The procedure may vary among health care providers and hospitals. What happens after the procedure? You can get dressed. You can return to your normal activities. It is up to you to get the results of your test. Ask your health care provider, or the department that is doing the test, when your results will be ready. Summary A coronary calcium scan is an imaging test used to look for deposits of calcium and other fatty materials (plaques) in the inner lining of the blood vessels of the heart (coronary arteries). Generally, this is a safe procedure. Tell your health care provider if you are pregnant or may be pregnant. No preparation is needed for this procedure. A CT scanner will take pictures of your heart. You can return to your normal activities after the scan is done. This information is not intended to replace advice given to you by your health care provider. Make sure you discuss any questions you have with your health care provider. Document Released: 12/27/2007 Document Revised: 05/19/2016 Document Reviewed: 05/19/2016 Elsevier Interactive Patient Education  2017 [employer] Inc[employer]  Follow-Up: At [facility], you and your health needs are our priority.  As part of our continuing mission to provide you with exceptional heart care, we have created designated Provider Care Teams.  These Care Teams include your   primary Cardiologist (physician) and Advanced Practice Providers (APPs -  Physician Assistants and Nurse Practitioners) who all work together to provide you with the care you need, when you need it.  We recommend signing up for the patient portal called "MyChart".  Sign  up information is provided on this After Visit Summary.  MyChart is used to connect with patients for Virtual Visits (Telemedicine).  Patients are able to view lab/test results, encounter notes, upcoming appointments, etc.  Non-urgent messages can be sent to your provider as well.   To learn more about what you can do with MyChart, go to [email or web address].    Your next appointment:   6 month(s)  The format for your next appointment:   In Person  Provider:   Christopher Schumann, MD    

## 2020-12-24 DIAGNOSIS — Z23 Encounter for immunization: Secondary | ICD-10-CM | POA: Diagnosis not present

## 2021-01-07 DIAGNOSIS — H5213 Myopia, bilateral: Secondary | ICD-10-CM | POA: Diagnosis not present

## 2021-01-07 DIAGNOSIS — H25013 Cortical age-related cataract, bilateral: Secondary | ICD-10-CM | POA: Diagnosis not present

## 2021-01-07 DIAGNOSIS — H2513 Age-related nuclear cataract, bilateral: Secondary | ICD-10-CM | POA: Diagnosis not present

## 2021-01-07 DIAGNOSIS — H43813 Vitreous degeneration, bilateral: Secondary | ICD-10-CM | POA: Diagnosis not present

## 2021-01-22 DIAGNOSIS — R413 Other amnesia: Secondary | ICD-10-CM | POA: Diagnosis not present

## 2021-01-22 DIAGNOSIS — N183 Chronic kidney disease, stage 3 unspecified: Secondary | ICD-10-CM | POA: Diagnosis not present

## 2021-01-22 DIAGNOSIS — I129 Hypertensive chronic kidney disease with stage 1 through stage 4 chronic kidney disease, or unspecified chronic kidney disease: Secondary | ICD-10-CM | POA: Diagnosis not present

## 2021-01-22 DIAGNOSIS — N400 Enlarged prostate without lower urinary tract symptoms: Secondary | ICD-10-CM | POA: Diagnosis not present

## 2021-01-22 DIAGNOSIS — H02409 Unspecified ptosis of unspecified eyelid: Secondary | ICD-10-CM | POA: Diagnosis not present

## 2021-01-22 DIAGNOSIS — R001 Bradycardia, unspecified: Secondary | ICD-10-CM | POA: Diagnosis not present

## 2021-01-22 DIAGNOSIS — M1990 Unspecified osteoarthritis, unspecified site: Secondary | ICD-10-CM | POA: Diagnosis not present

## 2021-01-22 DIAGNOSIS — M109 Gout, unspecified: Secondary | ICD-10-CM | POA: Diagnosis not present

## 2021-01-22 DIAGNOSIS — I350 Nonrheumatic aortic (valve) stenosis: Secondary | ICD-10-CM | POA: Diagnosis not present

## 2021-01-22 DIAGNOSIS — E785 Hyperlipidemia, unspecified: Secondary | ICD-10-CM | POA: Diagnosis not present

## 2021-01-29 DIAGNOSIS — D225 Melanocytic nevi of trunk: Secondary | ICD-10-CM | POA: Diagnosis not present

## 2021-01-29 DIAGNOSIS — L570 Actinic keratosis: Secondary | ICD-10-CM | POA: Diagnosis not present

## 2021-01-29 DIAGNOSIS — Z85820 Personal history of malignant melanoma of skin: Secondary | ICD-10-CM | POA: Diagnosis not present

## 2021-01-29 DIAGNOSIS — C44612 Basal cell carcinoma of skin of right upper limb, including shoulder: Secondary | ICD-10-CM | POA: Diagnosis not present

## 2021-01-29 DIAGNOSIS — D485 Neoplasm of uncertain behavior of skin: Secondary | ICD-10-CM | POA: Diagnosis not present

## 2021-01-29 DIAGNOSIS — H61001 Unspecified perichondritis of right external ear: Secondary | ICD-10-CM | POA: Diagnosis not present

## 2021-01-29 DIAGNOSIS — Z85828 Personal history of other malignant neoplasm of skin: Secondary | ICD-10-CM | POA: Diagnosis not present

## 2021-01-29 DIAGNOSIS — L821 Other seborrheic keratosis: Secondary | ICD-10-CM | POA: Diagnosis not present

## 2021-01-29 DIAGNOSIS — L814 Other melanin hyperpigmentation: Secondary | ICD-10-CM | POA: Diagnosis not present

## 2021-02-01 DIAGNOSIS — G4733 Obstructive sleep apnea (adult) (pediatric): Secondary | ICD-10-CM | POA: Diagnosis not present

## 2021-03-08 ENCOUNTER — Other Ambulatory Visit: Payer: Self-pay

## 2021-03-08 ENCOUNTER — Ambulatory Visit (INDEPENDENT_AMBULATORY_CARE_PROVIDER_SITE_OTHER)
Admission: RE | Admit: 2021-03-08 | Discharge: 2021-03-08 | Disposition: A | Discharge status: Home / Self Care | Payer: Self-pay | Source: Ambulatory Visit | Attending: Cardiology | Admitting: Cardiology

## 2021-03-08 DIAGNOSIS — I10 Essential (primary) hypertension: Secondary | ICD-10-CM

## 2021-03-20 ENCOUNTER — Other Ambulatory Visit: Payer: Self-pay | Admitting: *Deleted

## 2021-03-20 DIAGNOSIS — J479 Bronchiectasis, uncomplicated: Secondary | ICD-10-CM

## 2021-03-20 MED ORDER — ROSUVASTATIN CALCIUM 20 MG PO TABS: 20.0000 mg | ORAL_TABLET | Freq: Every day | ORAL | 3 refills | Status: DC

## 2021-03-31 IMAGING — NM NM DATSCAN
5 series · 37 of 45 positions shown · non-contrast
Comparison: None.

CLINICAL DATA: 73-year-old male with family history of Parkinson's
disease. Some altered mental status. No tremors reported.

EXAM:
NUCLEAR MEDICINE BRAIN IMAGING WITH SPECT  (DaTscan )
TECHNIQUE: SPECT images of the brain were obtained after intravenous injection
of radiopharmaceutical. 4 hour post injection imaging. Appropriate
positioning. 0.8 ml lugols solution administered in a.m
RADIOPHARMACEUTICALS:  5.0 millicuries I 123 Ioflupane

[Series 1: brain spect · 4.14mm/px · 5 of 120 frames shown]
[frame 11/120  full-range]
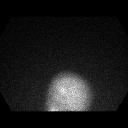
[frame 31/120  full-range]
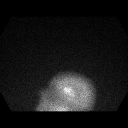
[frame 71/120  full-range]
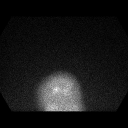
[frame 91/120  full-range]
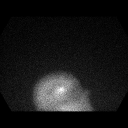
[frame 111/120  full-range]
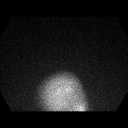

[Series 1: spect - (id) _(id)_cor · 4.1mm · 4.14mm/px · 5 of 128 frames shown]
[frame 11/128]
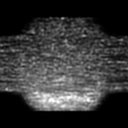
[frame 32/128]
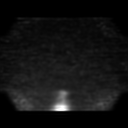
[frame 75/128]
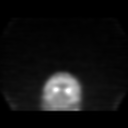
[frame 96/128]
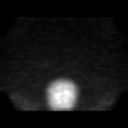
[frame 118/128]
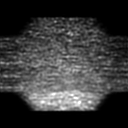

[Series 1: wbr_bone brain spect · 4.1mm · 4.14mm/px · 5 of 128 frames shown]
[frame 11/128]
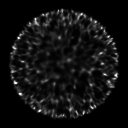
[frame 32/128]
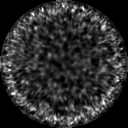
[frame 75/128]
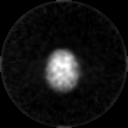
[frame 96/128]
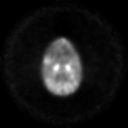
[frame 118/128]
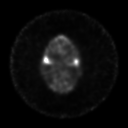

[Series 1: spect - (id) _(id)_tra · 4.1mm · 4.14mm/px · 5 of 128 frames shown]
[frame 11/128]
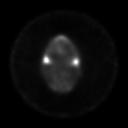
[frame 54/128]
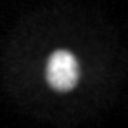
[frame 75/128]
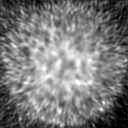
[frame 96/128]
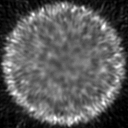
[frame 118/128]
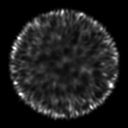

[Series 1034: mpr (id) range · 0.65mm/px · 17 of 41 slices shown]
[im 1/41]
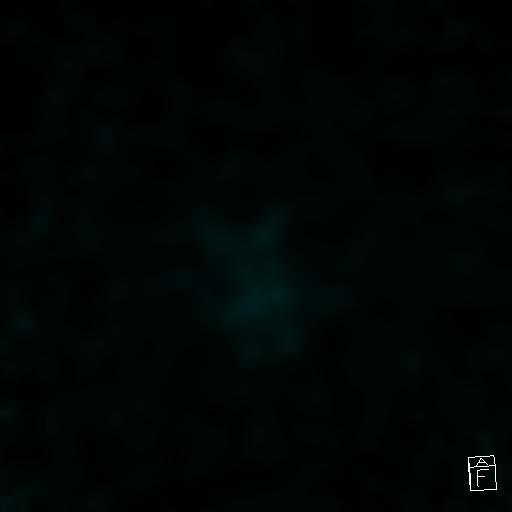
[im 5/41]
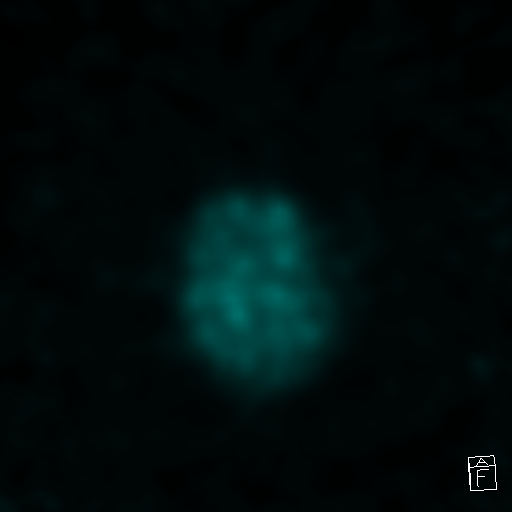
[im 7/41]
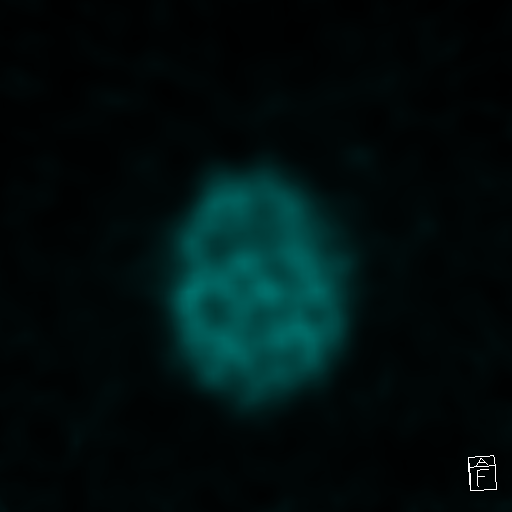
[im 9/41]
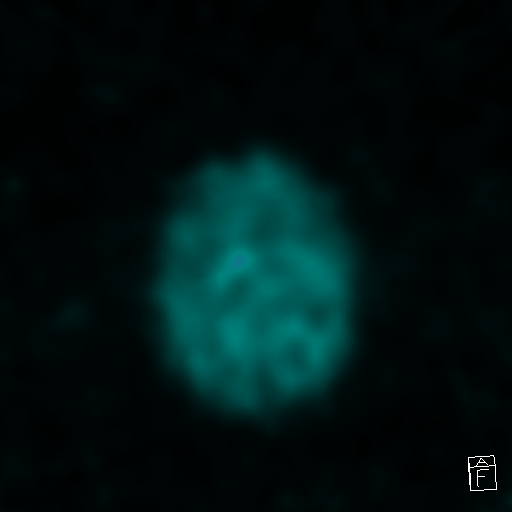
[im 11/41]
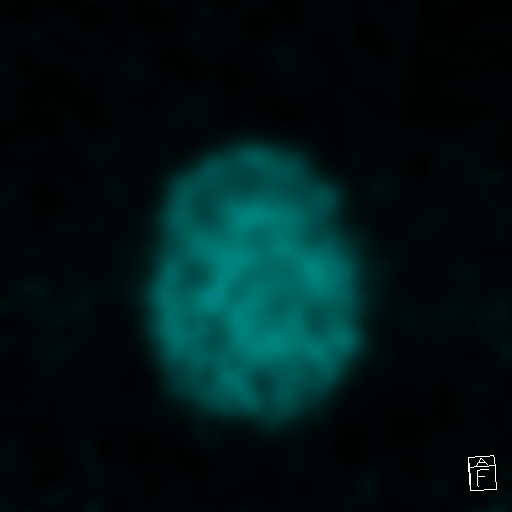
[im 15/41]
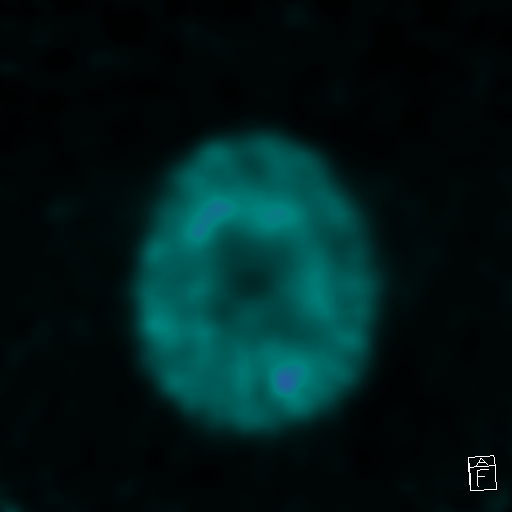
[im 17/41]
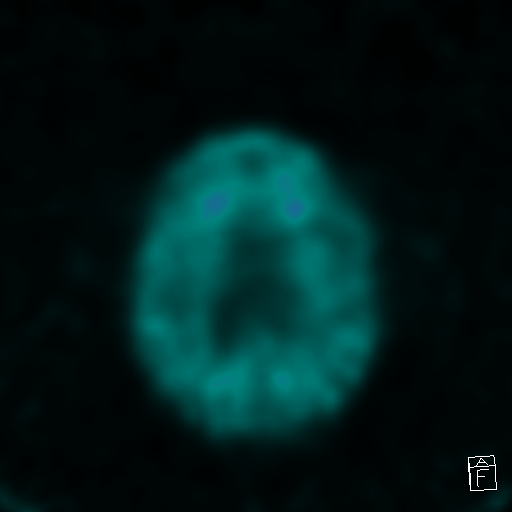
[im 19/41]
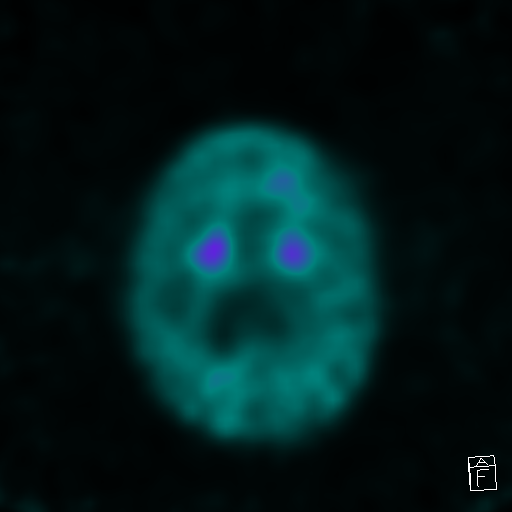
[im 21/41]
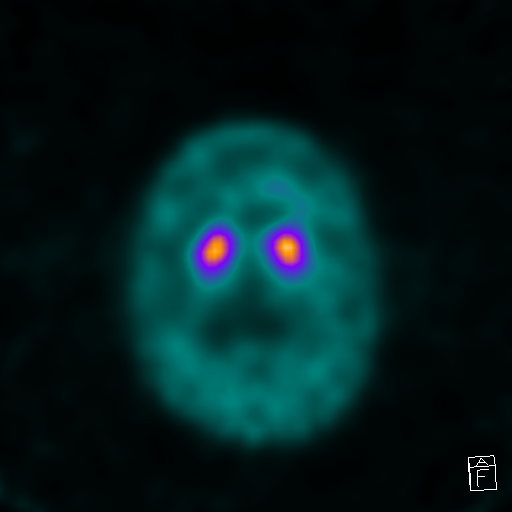
[im 23/41]
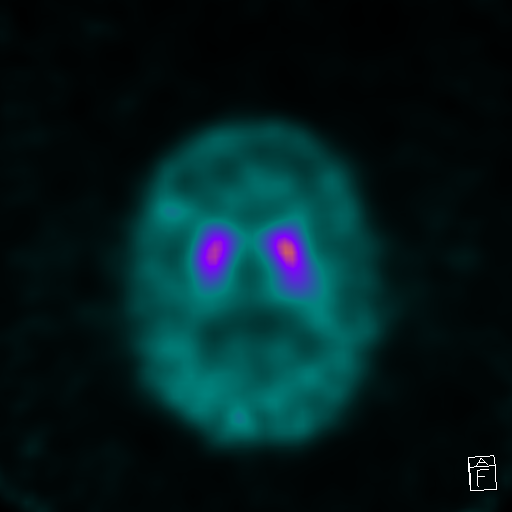
[im 27/41]
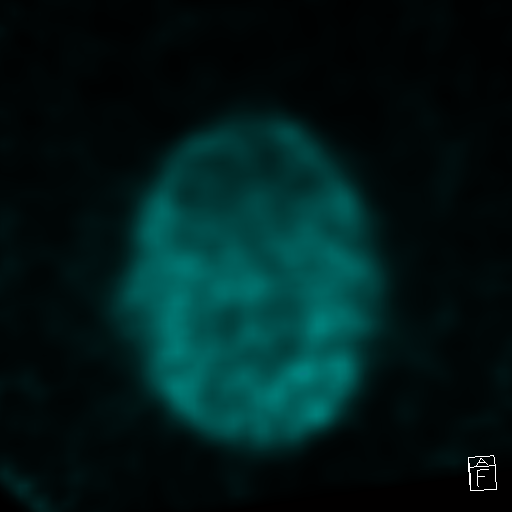
[im 29/41]
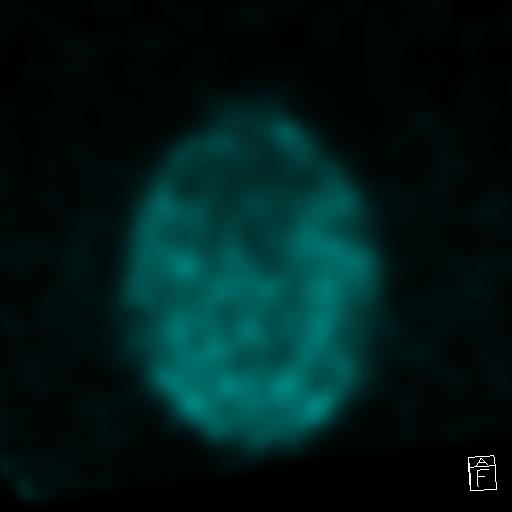
[im 31/41]
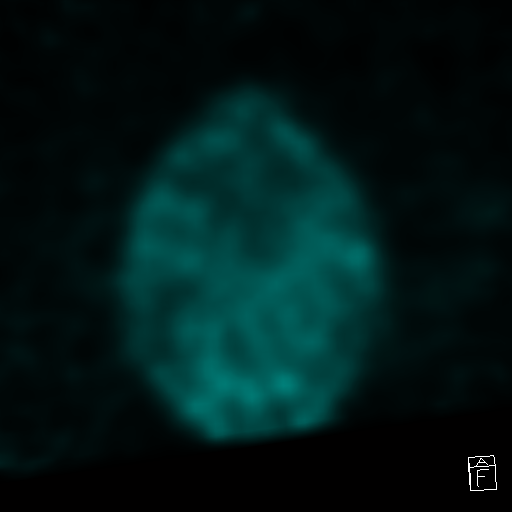
[im 33/41]
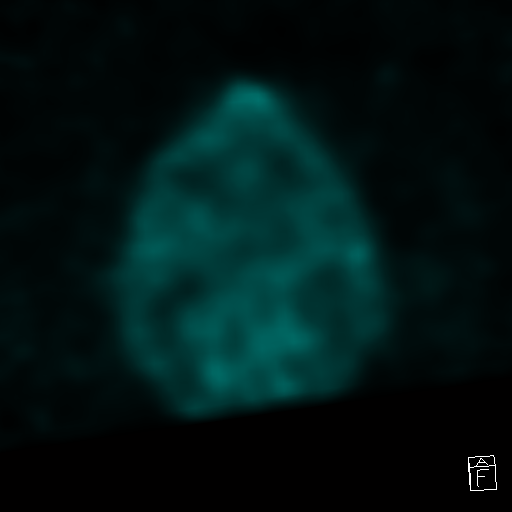
[im 35/41]
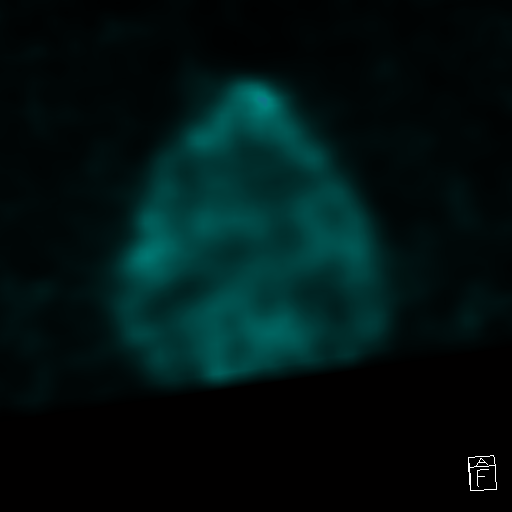
[im 39/41]
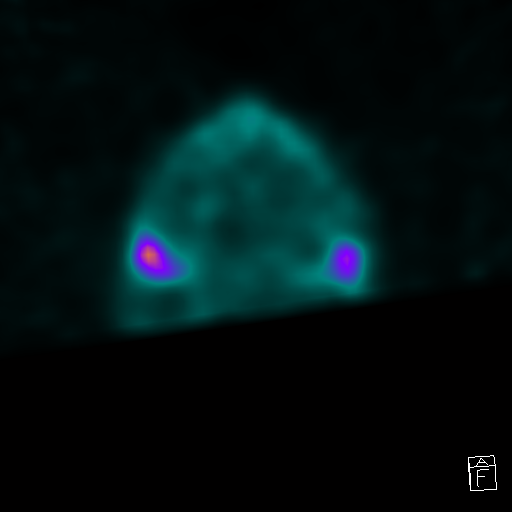
[im 41/41]
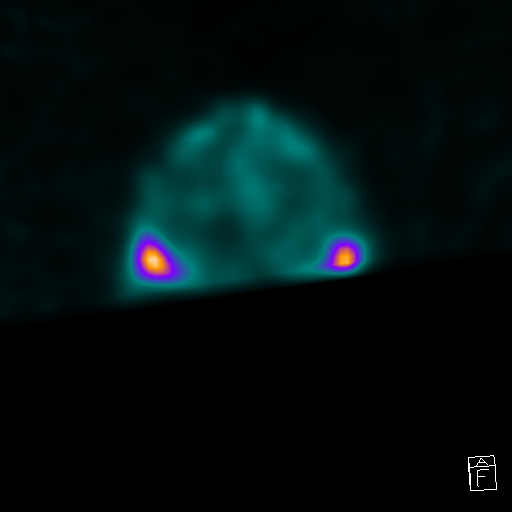

[37 of 45 positions shown; findings below may reference images not displayed]

FINDINGS: Symmetric radiotracer uptake within LEFT and RIGHT striata. The
heads of the caudate nuclei and the posterior striata (putamen) are
normal shape. No evidence of loss of dopamine transport populations
in the basal ganglia.
IMPRESSION: Symmetric activity in normal-shaped striata. No evidence of loss of
dopamine transport populations. Findings are NOT typical of
Parkinson's syndrome pathology.

## 2021-04-10 ENCOUNTER — Telehealth: Payer: Self-pay | Admitting: Cardiology

## 2021-04-10 ENCOUNTER — Ambulatory Visit (HOSPITAL_COMMUNITY)
Admission: RE | Admit: 2021-04-10 | Discharge: 2021-04-10 | Disposition: A | Discharge status: Home / Self Care | Payer: Medicare Other | Source: Ambulatory Visit | Attending: Cardiology | Admitting: Cardiology

## 2021-04-10 ENCOUNTER — Other Ambulatory Visit: Payer: Self-pay

## 2021-04-10 DIAGNOSIS — I712 Thoracic aortic aneurysm, without rupture, unspecified: Secondary | ICD-10-CM

## 2021-04-10 DIAGNOSIS — I10 Essential (primary) hypertension: Secondary | ICD-10-CM | POA: Diagnosis not present

## 2021-04-10 MED ORDER — VALSARTAN 320 MG PO TABS: 320.0000 mg | ORAL_TABLET | Freq: Every day | ORAL | 2 refills | Status: DC

## 2021-04-10 MED ORDER — GADOBUTROL 1 MMOL/ML IV SOLN
8.5000 mL | Freq: Once | INTRAVENOUS | Status: AC | PRN
  Administered 2021-04-10: 8.5 mL via INTRAVENOUS

## 2021-04-10 NOTE — Telephone Encounter (Signed)
*  STAT* If patient is at the pharmacy, call can be transferred to refill team.   1. Which medications need to be refilled? (please list name of each medication and dose if known)  valsartan (DIOVAN) 320 MG tablet  2. Which pharmacy/location (including street and city if local pharmacy) is medication to be sent to? [place], [place]  3. Do they need a 30 day or 90 day supply?  90 day supply

## 2021-04-10 NOTE — Telephone Encounter (Signed)
Refills has been sent to the pharmacy. 

## 2021-04-13 DIAGNOSIS — Z23 Encounter for immunization: Secondary | ICD-10-CM | POA: Diagnosis not present

## 2021-05-14 DIAGNOSIS — G3184 Mild cognitive impairment, so stated: Secondary | ICD-10-CM | POA: Diagnosis not present

## 2021-05-14 DIAGNOSIS — Z79899 Other long term (current) drug therapy: Secondary | ICD-10-CM | POA: Diagnosis not present

## 2021-05-14 DIAGNOSIS — R4189 Other symptoms and signs involving cognitive functions and awareness: Secondary | ICD-10-CM | POA: Diagnosis not present

## 2021-05-14 DIAGNOSIS — Z9989 Dependence on other enabling machines and devices: Secondary | ICD-10-CM | POA: Diagnosis not present

## 2021-05-14 DIAGNOSIS — G4733 Obstructive sleep apnea (adult) (pediatric): Secondary | ICD-10-CM | POA: Diagnosis not present

## 2021-06-09 NOTE — Progress Notes (Signed)
Cardiology Office Note:    Date:  06/10/2021   ID:  Albert Pierce, DOB April 20, 1946, MRN [id number]  PCP:  Prince Solian, MD  Cardiologist:  None  Electrophysiologist:  None   Referring MD: Prince Solian, MD   No chief complaint on file.   History of Present Illness:    Albert Pierce is a 75 y.o. male with a hx of hypertension, mild aortic stenosis who presents for follow-up.  He was referred by Albert Beaver, PA for evaluation of bradycardia, initially seen on 01/25/2020.  He was noted at clinic visit to have sinus bradycardia with rate 46.  He brought a log of his BP/pulse with him, and pulse has been as low as 39 at home.  Current medications include atenolol 25 mg daily, amlodipine 10 mg daily, and telmisartan 80 mg daily.  He denies any lightheadedness, syncope, fatigue, chest pain, dyspnea, or palpitations.  States that he exercises by walking his dog about 4 times per week for 20 to 30 minutes.  At initial clinic on 01/25/2020, and Tylenol was discontinued.  Echocardiogram on 02/13/2020 showed normal biventricular function, moderate focal basal septal LVH, grade 1 diastolic dysfunction, mild aortic stenosis, mild dilatation of the ascending aorta measuring 3 mm.  Zio patch x3 days on 02/28/2020 showed no significant arrhythmia, average heart rate 60 bpm.  Cardiac MRI on 04/04/2020 showed asymmetric hypertrophy measuring up to 16 mm and basal septum (8 mm and posterior wall), meeting criteria for HCM but patient's AS and hypertension could also be causes of asymmetric hypertrophy; no LGE, LVEF 72%, RVEF 63%, mild AI (regurgitant fraction 14%), ascending aortic dilatation measuring 40 mm.  Calcium score 1450 on 03/08/2021 (87th percentile); also noted to have bronchiectasis, referred to pulmonology.  MRA chest on 04/11/2021 showed ascending aortic dilatation measuring 40 mm.  Since last clinic visit, reports has been doing well.  Denies any chest pain.  Does report some heavy breathing  with significant exertion.  Denies any lightheadedness, syncope, or lower extremity edema.    Past Medical History:  Diagnosis Date   Appendicitis    Arthritis    Gout    Hyperlipidemia    Hypertension    Short-term memory loss 2019   mild    Past Surgical History:  Procedure Laterality Date   APPENDECTOMY     75 y.o.   COLONOSCOPY     EYE MUSCLE SURGERY     UMBILICAL HERNIA REPAIR  2016   Dr Dalbert Batman    Current Medications: Current Meds  Medication Sig   amLODipine (NORVASC) 10 MG tablet Take 10 mg by mouth daily. Take 2 Tablets by mouth Daily   aspirin 81 MG tablet Take 81 mg by mouth daily.   colchicine 0.6 MG tablet Take 1 tablet (0.6 mg total) by mouth 3 (three) times daily as needed.   donepezil (ARICEPT) 10 MG tablet Take one pill every morning with breakfast   fish oil-omega-3 fatty acids 1000 MG capsule Take 3,000 mg by mouth daily.   rosuvastatin (CRESTOR) 20 MG tablet Take 1 tablet (20 mg total) by mouth daily.   Turmeric 500 MG TABS Take by mouth.   valsartan (DIOVAN) 320 MG tablet Take 1 tablet (320 mg total) by mouth daily.   VITAMIN E PO Take by mouth.   [DISCONTINUED] donepezil (ARICEPT) 5 MG tablet Take 1 pill every morning after breakfast     Allergies:   Patient has no known allergies.   Social History   Socioeconomic History  Marital status: Married    Spouse name: Not on file   Number of children: 3   Years of education: 48   Highest education level: Professional school degree (e.g., MD, DDS, DVM, JD)  Occupational History   Not on file  Tobacco Use   Smoking status: Never   Smokeless tobacco: Never  Substance and Sexual Activity   Alcohol use: Yes    Alcohol/week: 2.0 - 8.0 standard drinks    Types: 2 - 8 Standard drinks or equivalent per week    Comment: generally wine, occ beer or hard liquor    Drug use: No   Sexual activity: Not on file  Other Topics Concern   Not on file  Social History Narrative   Lives at home with wife    Right handed   Caffeine: diet coke, 2-4 cups/day   Social Determinants of Health   Financial Resource Strain: Not on file  Food Insecurity: Not on file  Transportation Needs: Not on file  Physical Activity: Not on file  Stress: Not on file  Social Connections: Not on file     Family History: The patient's family history includes Parkinson's disease in his maternal aunt, maternal uncle, and mother; Tuberculosis in his maternal grandfather and paternal grandfather. There is no history of Dementia or Alzheimer's disease.  ROS:   Please see the history of present illness.    All other systems reviewed and are negative.  EKGs/Labs/Other Studies Reviewed:    The following studies were reviewed today:  EKG:  06/10/21:sinus bradycardia, rate 54, LAD 12/12/2020- The EKG ordered demonstrates sinus bradycardia, rate 51, Q waves in lead 1 and 2  04/19/2020- The EKG ordered demonstrates sinus rhythm, rate 51, left axis deviation, nonspecific T wave flattening  Recent Labs: No results found for requested labs within last 8760 hours.  Recent Lipid Panel    Component Value Date/Time   CHOL 164 02/14/2014 0923   TRIG 132.0 02/14/2014 0923   TRIG 264 (HH) 05/27/2006 0848   HDL 35.30 (L) 02/14/2014 0923   CHOLHDL 5 02/14/2014 0923   VLDL 26.4 02/14/2014 0923   LDLCALC 102 (H) 02/14/2014 0923   LDLDIRECT 79.1 04/17/2009 0819    Physical Exam:    VS:  BP 122/68   Pulse (!) 54   Ht 5\' 10"  (1.778 m)   Wt 185 lb 3.2 oz (84 kg)   SpO2 98%   BMI 26.57 kg/m     Wt Readings from Last 3 Encounters:  06/10/21 185 lb 3.2 oz (84 kg)  12/12/20 181 lb 12.8 oz (82.5 kg)  04/19/20 179 lb 12.8 oz (81.6 kg)     GEN:  in no acute distress  HEENT: Normal NECK: No JVD; No carotid bruits LYMPHATICS: No lymphadenopathy CARDIAC: regular, bradycardic, 2/6 systolic murmur RESPIRATORY:  Clear to auscultation without rales, wheezing or rhonchi  ABDOMEN: Soft, non-tender,  non-distended MUSCULOSKELETAL:  No edema; No deformity  SKIN: Warm and dry NEUROLOGIC:  Alert and oriented x 3 PSYCHIATRIC:  Normal affect   ASSESSMENT:    1. Elevated coronary artery calcium score   2. Essential hypertension   3. Hyperlipidemia, unspecified hyperlipidemia type   4. Shortness of breath   5. LVH (left ventricular hypertrophy)   6. Aortic dilatation (HCC)     PLAN:    CAD: Calcium score 1450 on 03/08/2021 (87th percentile).  Denies any chest pain.  Does report mild dyspnea -Continue aspirin 81 mg daily -Continue rosuvastatin 20 mg daily, will check lipid panel -  Recommend Lexiscan Myoview to rule out ischemia  Sinus bradycardia: Heart rate as low as high 30s at home.  He appears asymptomatic.  Suspect bradycardia is secondary to atenolol use.  Discontinued atenolol. Zio patch after discontinuing atenolol shows no significant bradyarrhythmia, average heart rate 60 bpm  LVH :Echocardiogram on 02/13/2020 showed moderate focal basal septal LVH.  Cardiac MRI on 04/04/2020 showed asymmetric hypertrophy measuring up to 16 mm and basal septum (8 mm and posterior wall), meeting criteria for HCM.  However the patient's AS and hypertension could also be causes of asymmetric hypertrophy.  No LGE on MRI.  Hypertension: On amlodipine 10 mg daily and valsartan 320 mg daily.  Appears controlled.  Aortic stenosis: Echocardiogram on 02/13/2020 showed mild AS, mild AI. Mild AI on CMR 04/04/2020.  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  MRA on 04/04/2020 showed ascending aorta measured 40 mm.  MRA chest on 04/11/2021 showed ascending aortic dilatation measuring 40 mm.  Hyperlipidemia: On rosuvastatin 10 mg daily,  LDL 89 07/16/20.  Calcium score 1450 on 03/08/2021 (87th percentile), rosuvastatin increased to 20 mg daily.  We will recheck lipid panel  Bronchiectasis: Noted on calcium score, referred to pulmonology.  OSA: Reports compliance  with CPAP  RTC in 6 months  Shared Decision Making/Informed Consent The risks [chest pain, shortness of breath, cardiac arrhythmias, dizziness, blood pressure fluctuations, myocardial infarction, stroke/transient ischemic attack, nausea, vomiting, allergic reaction, radiation exposure, metallic taste sensation and life-threatening complications (estimated to be 1 in 10,000)], benefits (risk stratification, diagnosing coronary artery disease, treatment guidance) and alternatives of a nuclear stress test were discussed in detail with Mr. Hoar and he agrees to proceed.   Medication Adjustments/Labs and Tests Ordered: Current medicines are reviewed at length with the patient today.  Concerns regarding medicines are outlined above.  Orders Placed This Encounter  Procedures   Basic metabolic panel   Lipid panel   MYOCARDIAL PERFUSION IMAGING   EKG 12-Lead    No orders of the defined types were placed in this encounter.   Patient Instructions  Medication Instructions:  Your physician recommends that you continue on your current medications as directed. Please refer to the Current Medication list given to you today.  *If you need a refill on your cardiac medications before your next appointment, please call your pharmacy*   Lab Work: BMET, Lipid today  If you have labs (blood work) drawn today and your tests are completely normal, you will receive your results only by: [place] (if you have MyChart) OR A paper copy in the mail If you have any lab test that is abnormal or we need to change your treatment, we will call you to review the results.   Testing/Procedures: Your physician has requested that you have a Lexicographer (at [facility]). For further information please visit [email or web address]. Please follow instruction sheet, as given.   How to prepare for your Myocardial Perfusion Test: Do not eat or drink 3 hours prior to your test, except you may have water. Do  not consume products containing caffeine (regular or decaffeinated) 12 hours prior to your test. (ex: coffee, chocolate, sodas, tea). Do bring a list of your current medications with you.  If not listed below, you may take your medications as normal. Do wear comfortable clothes (no dresses or overalls) and walking shoes, tennis shoes preferred (No heels or open toe shoes are allowed). Do NOT wear cologne, perfume, aftershave, or  lotions (deodorant is allowed). The test will take approximately 3 to 4 hours to complete If these instructions are not followed, your test will have to be rescheduled.  Follow-Up: At [facility], you and your health needs are our priority.  As part of our continuing mission to provide you with exceptional heart care, we have created designated Provider Care Teams.  These Care Teams include your primary Cardiologist (physician) and Advanced Practice Providers (APPs -  Physician Assistants and Nurse Practitioners) who all work together to provide you with the care you need, when you need it.  We recommend signing up for the patient portal called "MyChart".  Sign up information is provided on this After Visit Summary.  MyChart is used to connect with patients for Virtual Visits (Telemedicine).  Patients are able to view lab/test results, encounter notes, upcoming appointments, etc.  Non-urgent messages can be sent to your provider as well.   To learn more about what you can do with MyChart, go to [email or web address].    Your next appointment:   6 month(s)  The format for your next appointment:   In Person  Provider:   Dr. Gardiner Rhyme      Signed, Donato Heinz, MD  06/10/2021 9:33 AM    [place]

## 2021-06-10 ENCOUNTER — Other Ambulatory Visit: Payer: Self-pay

## 2021-06-10 ENCOUNTER — Ambulatory Visit (INDEPENDENT_AMBULATORY_CARE_PROVIDER_SITE_OTHER): Payer: Medicare Other | Admitting: Cardiology

## 2021-06-10 ENCOUNTER — Encounter: Payer: Self-pay | Admitting: Cardiology

## 2021-06-10 VITALS — BP 122/68 | HR 54 | Ht 70.0 in | Wt 185.2 lb

## 2021-06-10 DIAGNOSIS — R931 Abnormal findings on diagnostic imaging of heart and coronary circulation: Secondary | ICD-10-CM | POA: Diagnosis not present

## 2021-06-10 DIAGNOSIS — R0602 Shortness of breath: Secondary | ICD-10-CM

## 2021-06-10 DIAGNOSIS — I10 Essential (primary) hypertension: Secondary | ICD-10-CM

## 2021-06-10 DIAGNOSIS — I517 Cardiomegaly: Secondary | ICD-10-CM | POA: Diagnosis not present

## 2021-06-10 DIAGNOSIS — E785 Hyperlipidemia, unspecified: Secondary | ICD-10-CM | POA: Diagnosis not present

## 2021-06-10 DIAGNOSIS — I77819 Aortic ectasia, unspecified site: Secondary | ICD-10-CM

## 2021-06-10 NOTE — Patient Instructions (Signed)
Medication Instructions:  Your physician recommends that you continue on your current medications as directed. Please refer to the Current Medication list given to you today.  *If you need a refill on your cardiac medications before your next appointment, please call your pharmacy*   Lab Work: BMET, Lipid today  If you have labs (blood work) drawn today and your tests are completely normal, you will receive your results only by: [place] (if you have MyChart) OR A paper copy in the mail If you have any lab test that is abnormal or we need to change your treatment, we will call you to review the results.   Testing/Procedures: Your physician has requested that you have a Lexicographer (at [facility]). For further information please visit [email or web address]. Please follow instruction sheet, as given.   How to prepare for your Myocardial Perfusion Test: Do not eat or drink 3 hours prior to your test, except you may have water. Do not consume products containing caffeine (regular or decaffeinated) 12 hours prior to your test. (ex: coffee, chocolate, sodas, tea). Do bring a list of your current medications with you.  If not listed below, you may take your medications as normal. Do wear comfortable clothes (no dresses or overalls) and walking shoes, tennis shoes preferred (No heels or open toe shoes are allowed). Do NOT wear cologne, perfume, aftershave, or lotions (deodorant is allowed). The test will take approximately 3 to 4 hours to complete If these instructions are not followed, your test will have to be rescheduled.  Follow-Up: At [facility], you and your health needs are our priority.  As part of our continuing mission to provide you with exceptional heart care, we have created designated Provider Care Teams.  These Care Teams include your primary Cardiologist (physician) and Advanced Practice Providers (APPs -  Physician Assistants and Nurse Practitioners) who all work  together to provide you with the care you need, when you need it.  We recommend signing up for the patient portal called "MyChart".  Sign up information is provided on this After Visit Summary.  MyChart is used to connect with patients for Virtual Visits (Telemedicine).  Patients are able to view lab/test results, encounter notes, upcoming appointments, etc.  Non-urgent messages can be sent to your provider as well.   To learn more about what you can do with MyChart, go to [email or web address].    Your next appointment:   6 month(s)  The format for your next appointment:   In Person  Provider:   Dr. Gardiner Rhyme

## 2021-06-11 LAB — LIPID PANEL
Chol/HDL Ratio: 2.7 ratio (ref 0.0–5.0)
Cholesterol, Total: 135 mg/dL (ref 100–199)
HDL: 50 mg/dL (ref >39)
LDL Chol Calc (NIH): 69 mg/dL (ref 0–99)
Triglycerides: 81 mg/dL (ref 0–149)
VLDL Cholesterol Cal: 16 mg/dL (ref 5–40)

## 2021-06-11 LAB — BASIC METABOLIC PANEL
BUN/Creatinine Ratio: 18 (ref 10–24)
BUN: 17 mg/dL (ref 8–27)
CO2: 23 mmol/L (ref 20–29)
Calcium: 8.9 mg/dL (ref 8.6–10.2)
Chloride: 105 mmol/L (ref 96–106)
Creatinine, Ser: 0.95 mg/dL (ref 0.76–1.27)
Glucose: 89 mg/dL (ref 70–99)
Potassium: 4.1 mmol/L (ref 3.5–5.2)
Sodium: 143 mmol/L (ref 134–144)
eGFR: 84 mL/min/{1.73_m2} (ref >59)

## 2021-06-13 ENCOUNTER — Other Ambulatory Visit: Payer: Self-pay

## 2021-06-13 ENCOUNTER — Encounter: Payer: Self-pay | Admitting: Internal Medicine

## 2021-06-13 ENCOUNTER — Ambulatory Visit (INDEPENDENT_AMBULATORY_CARE_PROVIDER_SITE_OTHER): Payer: Medicare Other | Admitting: Internal Medicine

## 2021-06-13 VITALS — BP 120/78 | HR 53 | Temp 98.1°F | Ht 69.0 in | Wt 186.0 lb

## 2021-06-13 DIAGNOSIS — J479 Bronchiectasis, uncomplicated: Secondary | ICD-10-CM

## 2021-06-13 NOTE — Progress Notes (Signed)
VERLAND SPRINKLE    [id number]    September 14, 1945  Primary Care Physician:Avva, Steva Ready, MD  Referring Physician: Donato Heinz, [address] [place],  [place] [address] Reason for Consultation: abnormal CT scan Date of Consultation: 06/13/2021  Chief complaint:   Chief Complaint  Patient presents with   Consult    Patient wife reports that she feels he is breathing more heavily.      HPI:  Albert Pierce is a 75 y.o. man with history of CAD who had a CT cardiac scoring scan in August 2022. IT showed areas of bronchiectasis. He is here for further evaluation.  He denies any issues with his breathing. Wife says he breathes heavily. Denies any coughing, wheezing. Never had pneumonia or bronchitis.   No childhood respiratory distress.  He does go to a Physiological scientist and does walk the dog. Keeps up with peers without having to stop and rest.  Dyspnea does not limit daily activities.   Appetite is good, no fevers chills weight loss.   He does have some memory loss over the last 3-4 years,. Sees Dr Jimmye Norman Neurology at [facility] On donepazil and also being treated for OSA with CPAP.     Social history:  Occupation: retired Forensic psychologist, Holiday representative. Travels, mostly in the [place].  Exposures: lives at home with wife and golden retriever Smoking history: never smoker.   Social History   Occupational History   Not on file  Tobacco Use   Smoking status: Never   Smokeless tobacco: Never  Substance and Sexual Activity   Alcohol use: Yes    Alcohol/week: 2.0 - 8.0 standard drinks    Types: 2 - 8 Standard drinks or equivalent per week    Comment: generally wine, occ beer or hard liquor    Drug use: No   Sexual activity: Not on file    Relevant family history:  Family History  Problem Relation Age of Onset   Parkinson's disease Mother    Tuberculosis Maternal Grandfather    Tuberculosis Paternal Grandfather    Parkinson's disease  Maternal Aunt    Parkinson's disease Maternal Uncle    Dementia Neg Hx    Alzheimer's disease Neg Hx     Past Medical History:  Diagnosis Date   Appendicitis    Arthritis    Gout    Hyperlipidemia    Hypertension    Short-term memory loss 2019   mild    Past Surgical History:  Procedure Laterality Date   APPENDECTOMY     75 y.o.   COLONOSCOPY     EYE MUSCLE SURGERY     UMBILICAL HERNIA REPAIR  2016   Dr Dalbert Batman     Physical Exam: Blood pressure 120/78, pulse (!) 53, temperature 98.1 F (36.7 C), temperature source Oral, height 5\' 9"  (1.753 m), weight 186 lb (84.4 kg), SpO2 96 %. Gen:      No acute distress ENT:  no nasal polyps, mucus membranes moist Lungs:    No increased respiratory effort, symmetric chest wall excursion, clear to auscultation bilaterally, no wheezes or crackles CV:      bradycardic, regular; no murmurs, rubs, or gallops.  No pedal edema Abd:      + bowel sounds; soft, non-tender; no distension MSK: no acute synovitis of DIP or PIP joints, no mechanics hands.  Skin:      Warm and dry; no rashes Neuro: normal speech, no focal facial  asymmetry Psych: alert and oriented x3, normal mood and affect   Data Reviewed/Medical Decision Making:  Independent interpretation of tests: Imaging:  Review of patient's CT cardiac lung windows images revealed mild lower lobe bronchiectasis. The patient's images have been independently reviewed by me.    PFTs:  No flowsheet data found.  Labs:  Lab Results  Component Value Date   WBC 8.6 02/14/2014   HGB 14.9 02/14/2014   HCT 42.8 02/14/2014   MCV 88.3 02/14/2014   PLT 209.0 02/14/2014   Lab Results  Component Value Date   NA 143 06/10/2021   K 4.1 06/10/2021   CL 105 06/10/2021   CO2 23 06/10/2021     Echocardiogram August 2021 - Grade 1 diastolic dysfunction LVEF preserved.   Immunization status:  Immunization History  Administered Date(s) Administered   Influenza Split 04/24/2009   Influenza  Whole 04/24/2009   Influenza, High Dose Seasonal PF 03/31/2016   Influenza, Quadrivalent, Recombinant, Inj, Pf 04/17/2018, 03/24/2019, 04/14/2020, 04/13/2021   Influenza,inj,Quad PF,6+ Mos 03/29/2015   Influenza-Unspecified 04/27/2017   PFIZER(Purple Top)SARS-COV-2 Vaccination 08/19/2019, 09/13/2019   Pneumococcal Conjugate-13 12/29/2012, 05/19/2018   Pneumococcal Polysaccharide-23 12/29/2012   Pneumococcal-Unspecified 12/29/2012   Td 04/24/2009   Tdap 04/24/2009, 02/03/2014   Zoster, Live 11/26/2010     I reviewed prior external note(s) from cardiology, pcp  I reviewed the result(s) of the labs and imaging as noted above.   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 I reviewed the scan with him and his wife. He is asymptomatic. At the most I would obtain some baseline PFTs to evaluate lung function should he develop symptoms in the future. Fortunately he has no symptoms currently and therefore I don't believe this  needs any additional treatment or consideration. He will come back and see me if he develops respiratory issues.   He is up to date on flu shot and covid boosters.   We discussed disease management and progression at length today.    Return to Care: As needed.   Lenice Llamas, MD Pulmonary and [place]  CC: Donato Heinz*

## 2021-06-13 NOTE — Patient Instructions (Signed)
Follow up with me as needed. I will contact you with the results of your breathing testing.   PFT scheduled - 1 hour.

## 2021-06-17 DIAGNOSIS — Z23 Encounter for immunization: Secondary | ICD-10-CM | POA: Diagnosis not present

## 2021-07-18 ENCOUNTER — Telehealth (HOSPITAL_COMMUNITY): Payer: Self-pay | Admitting: *Deleted

## 2021-07-18 NOTE — Telephone Encounter (Signed)
Left message on voicemail per DPR in reference to upcoming appointment scheduled on  07/25/20 with detailed instructions given per Myocardial Perfusion Study Information Sheet for the test. LM to arrive 15 minutes early, and that it is imperative to arrive on time for appointment to keep from having the test rescheduled. If you need to cancel or reschedule your appointment, please call the office within 24 hours of your appointment. Failure to do so may result in a cancellation of your appointment, and a $50 no show fee. Phone number given for call back for any questions. Kirstie Peri

## 2021-07-23 ENCOUNTER — Other Ambulatory Visit: Payer: Self-pay

## 2021-07-23 ENCOUNTER — Encounter: Payer: Medicare Other | Attending: Psychology | Admitting: Psychology

## 2021-07-23 DIAGNOSIS — G4733 Obstructive sleep apnea (adult) (pediatric): Secondary | ICD-10-CM | POA: Diagnosis not present

## 2021-07-23 DIAGNOSIS — R4189 Other symptoms and signs involving cognitive functions and awareness: Secondary | ICD-10-CM | POA: Insufficient documentation

## 2021-07-23 DIAGNOSIS — G3184 Mild cognitive impairment, so stated: Secondary | ICD-10-CM | POA: Insufficient documentation

## 2021-07-23 NOTE — Progress Notes (Signed)
Neuropsychological Consultation   Patient:   Albert Pierce   DOB:   September 02, 1945  MR Number:  [id number]  Location:  [facility] [place], [place] [id number] [place] [id number] Dept: [phone]           Date of Service:   07/23/2021  Start Time:   9 AM End Time:   10 AM  Provider/Observer:  Albert Pierce, Psy.D.       Clinical Neuropsychologist       Billing Code/Service: [id number]  Chief Complaint:    Albert Pierce is a 76 year old male referred for neuropsychological evaluation and is here for follow-up repeat testing after his initial evaluation indicated significant memory deficits for both visual and auditory information that appeared to be progressive in nature.  There were also impairments in fine motor coordination and changes in attention and working memory as well.  This is a follow-up assessment to provide test retest analysis.  Reason for Service:  Albert Pierce is a 76 year old male referred for neuropsychological evaluation and is here for follow-up repeat testing after his initial evaluation indicated significant memory deficits for both visual and auditory information that appeared to be progressive in nature.  There were also impairments in fine motor coordination and changes in attention and working memory as well.  This is a follow-up assessment to provide test retest analysis.  I will not go over previous clinical history as it is readily available in his EMR with complete history found in previous neuropsychologist work-up on 08/21/2020.  Today, the patient and his wife are present for the clinical interview and the patient did not elaborate on any observed changes or worsening in memory functions.  The patient's wife provided most of the information with the patient passively agreeing with what she primarily said.  The patient's wife reports that she  observes a worsening in his memory and that his memory loss is more significant than it was the previous year.  This is primarily related to changes in his ability to learn new information and short-term memory function deficits.  She denies any appearance of progressive or changes in motor functioning and other than a stooped posture his gait was fairly appropriate today in the clinical interview and there was no signs or indications of tremors.  There is also denied any hallucinations or delusional type symptoms.  Since our initial evaluation the patient has been diagnosed with severe obstructive sleep apnea and has been prescribed a CPAP which she has been using regularly with good compliance.  They did initially go directly to the nose pillow device aperture and he has not been tried on a full mask.  There is some concern about whether he is getting adequate pressures and there is a schedule time to return to the neurologist at [facility] to read his results from his CPAP device.  The patient's wife reports that there is been a lot of stress and changes over 2022.  They sold their home and bought at townhome that needed to be remodeled extensively.  During this time and lives with her daughter for 5 or 6 months and there was a lot of changes in activities.  They have just relocated into their townhome.  Behavioral Observation: Albert Pierce  presents as a 76 y.o.-year-old Right handed Caucasian Male who appeared his stated age. his dress was Appropriate and he was Well Groomed and his manners  were Appropriate to the situation.  his participation was indicative of Appropriate behaviors.  There were not physical disabilities noted.  he displayed an appropriate level of cooperation and motivation.     Interactions:    Active Appropriate and Redirectable  Attention:   abnormal and attention span appeared shorter than expected for age  Memory:   abnormal; remote memory intact, recent memory  impaired  Visuo-spatial:  not examined  Speech (Volume):  low  Speech:   normal; normal  Thought Process:  Coherent and Relevant  Though Content:  WNL; not suicidal and not homicidal  Orientation:   person, place, and situation  Judgment:   Fair  Planning:   Poor  Affect:    Appropriate  Mood:    Dysphoric  Insight:   Fair  Intelligence:   very high  Medical History:   Past Medical History:  Diagnosis Date   Appendicitis    Arthritis    Gout    Hyperlipidemia    Hypertension    Short-term memory loss 2019   mild         Patient Active Problem List   Diagnosis Date Noted   Mild cognitive impairment with memory loss 02/22/2020   GOUT 12/31/2007   HYPERLIPIDEMIA 03/25/2007   HYPERTENSION 03/25/2007     Psychiatric History:  No prior psychiatric history  Family Med/Psych History:  Family History  Problem Relation Age of Onset   Parkinson's disease Mother    Tuberculosis Maternal Grandfather    Tuberculosis Paternal Grandfather    Parkinson's disease Maternal Aunt    Parkinson's disease Maternal Uncle    Dementia Neg Hx    Alzheimer's disease Neg Hx    Lung cancer Neg Hx     Impression/DX:   Chief Complaint:    Albert Pierce is a 76 year old male referred for neuropsychological evaluation and is here for follow-up repeat testing after his initial evaluation indicated significant memory deficits for both visual and auditory information that appeared to be progressive in nature.  There were also impairments in fine motor coordination and changes in attention and working memory as well.  This is a follow-up assessment to provide test retest analysis.  Disposition/Plan:  The patient will be administered the same battery of tests that he was administered on 10/05/2020.  This included the control oral Word Association test, the [place], the [employer], the Wechsler Adult Intelligence Scale and the Wechsler Memory Scale's.  Diagnosis:     Mild cognitive impairment with memory loss  Severe obstructive sleep apnea  Cognitive changes         Electronically Signed   _______________________ Albert Pierce, Psy.D. Clinical Neuropsychologist

## 2021-07-25 ENCOUNTER — Other Ambulatory Visit: Payer: Self-pay

## 2021-07-25 ENCOUNTER — Ambulatory Visit (HOSPITAL_COMMUNITY): Payer: Medicare Other | Attending: Cardiology

## 2021-07-25 DIAGNOSIS — R931 Abnormal findings on diagnostic imaging of heart and coronary circulation: Secondary | ICD-10-CM | POA: Diagnosis not present

## 2021-07-25 DIAGNOSIS — R0602 Shortness of breath: Secondary | ICD-10-CM | POA: Diagnosis not present

## 2021-07-25 LAB — MYOCARDIAL PERFUSION IMAGING
Base ST Depression (mm): 0 mm
LV dias vol: 113 mL (ref 62–150)
LV sys vol: 52 mL
Nuc Stress EF: 54 %
Peak HR: 64 {beats}/min
Rest HR: 44 {beats}/min
Rest Nuclear Isotope Dose: 10.7 mCi
SDS: 0
SRS: 0
SSS: 0
ST Depression (mm): 0 mm
Stress Nuclear Isotope Dose: 31.1 mCi
TID: 1.07

## 2021-07-25 MED ORDER — TECHNETIUM TC 99M TETROFOSMIN IV KIT
10.7000 | PACK | Freq: Once | INTRAVENOUS | Status: AC | PRN
  Administered 2021-07-25: 10.7 via INTRAVENOUS
  Filled 2021-07-25: qty 11

## 2021-07-25 MED ORDER — TECHNETIUM TC 99M TETROFOSMIN IV KIT
31.1000 | PACK | Freq: Once | INTRAVENOUS | Status: AC | PRN
Start: 2021-07-25 — End: 2021-07-25
  Administered 2021-07-25: 31.1 via INTRAVENOUS
  Filled 2021-07-25: qty 32

## 2021-07-25 MED ORDER — REGADENOSON 0.4 MG/5ML IV SOLN
0.4000 mg | Freq: Once | INTRAVENOUS | Status: AC
  Administered 2021-07-25: 0.4 mg via INTRAVENOUS

## 2021-07-30 ENCOUNTER — Other Ambulatory Visit: Payer: Self-pay

## 2021-07-30 DIAGNOSIS — G3184 Mild cognitive impairment, so stated: Secondary | ICD-10-CM | POA: Diagnosis not present

## 2021-07-30 NOTE — Progress Notes (Signed)
Behavioral Observations The patient appeared well-groomed and appropriately dressed. His manners were polite and appropriate to the situation. The patient demonstrated  a positive attitude toward testing and showed good effort.   The patient completed testing items at a quick pace and was able to complete all scheduled tests in one session.  Neuropsychology Note  Albert Pierce completed 120 minutes of neuropsychological testing with technician, Albert Pierce, BA, under the supervision of Albert Skill, PsyD., Clinical Neuropsychologist. The patient did not appear overtly distressed by the testing session, per behavioral observation or via self-report to the technician. Rest breaks were offered.   Clinical Decision Making: In considering the patient's current level of functioning, level of presumed impairment, nature of symptoms, emotional and behavioral responses during clinical interview, level of literacy, and observed level of motivation/effort, a battery of tests was selected by Albert Pierce during initial consultation on 07/23/2021. This was communicated to the technician. Communication between the neuropsychologist and technician was ongoing throughout the testing session and changes were made as deemed necessary based on patient performance on testing, technician observations and additional pertinent factors such as those listed above.  Tests Administered: Controlled Oral Word Association Test (COWAT; FAS & Animals)  [place] (BNT) Trail Making Test (TMT; Part A & B) Wechsler Adult Intelligence Scale, 4th Edition (WAIS-IV) Wechsler Memory Scale, 4th Edition (WMS-IV); Older Adult Battery    Results:  COWAT FAS Total = 16 Z = -2.15 Animals Total = 15 Z = -0.76      [place] Naming Test # Correct without assistance: 27 # Of stimulus cues given: 4 # Correct with stimulus cue: 0 # Of phonemic cues given: 4 # Correct with phonemic cue: 3      Trail Making  Test Part A Time = 0:41.53 0 Errors Part B Time = 1:31.82 0 Errors      WAIS-IV  Composite Score Summary  Scale Sum of Scaled Scores Composite Score Percentile Rank 95% Conf. Interval Qualitative Description  Verbal Comprehension 33 VCI 105 63 99-110 Average  Perceptual Reasoning 45 PRI 129 97 121-134 Superior  Working Memory 20 WMI 100 50 93-107 Average  Processing Speed 25 PSI 114 82 104-121 High Average  Full Scale 123 FSIQ 115 84 111-119 High Average  General Ability 78 GAI 120 91 114-124 Superior       Verbal Comprehension Subtests Summary  Subtest Raw Score Scaled Score Percentile Rank Reference Group Scaled Score SEM  Similarities 25 11 63 10 0.95  Vocabulary 46 12 75 13 0.67  Information 15 10 50 11 0.73  (Comprehension) 22 10 50 9 1.27        Perceptual Reasoning Subtests Summary  Subtest Raw Score Scaled Score Percentile Rank Reference Group Scaled Score SEM  Block Design 46 14 91 10 0.99  Matrix Reasoning 16 12 75 8 0.90  Visual Puzzles 22 19 99.9 14 0.99  (Picture Completion) 6 7 16 5  0.99       Working Doctor, general practice Raw Score Scaled Score Percentile Rank Reference Group Scaled Score SEM  Digit Span 24 10 50 8 0.73  Arithmetic 13 10 50 9 1.20       Processing Speed Subtests Summary  Subtest Raw Score Scaled Score Percentile Rank Reference Group Scaled Score SEM  Symbol Search 27 11 63 8 1.12  Coding 71 14 91 10 1.12        WMS - Older Adult  Index Score Summary  Index Sum of  Scaled Scores Index Score Percentile Rank 95% Confidence Interval Qualitative Descriptor  Auditory Memory (AMI) 28 82 12 77-89 Low Average  Visual Memory (VMI) 5 54 0.1 50-60 Extremely Low  Immediate Memory (IMI) 21 81 10 76-88 Low Average  Delayed Memory (DMI) 12 63 1 58-74 Extremely Low      Primary Subtest Scaled Score Summary  Subtest Domain Raw Score Scaled Score Percentile Rank  Logical Memory I AM 22 7 16   Logical  Memory II AM 3 3 1   Verbal Paired Associates I AM 20 10 50  Verbal Paired Associates II AM 4 8 25   Visual Reproduction I VM 18 4 2   Visual Reproduction II VM 0 1 0.1  Symbol Span VWM 13 8 25        Auditory Memory Process Score Summary  Process Score Raw Score Scaled Score Percentile Rank Cumulative Percentage (Base Rate)  LM II Recognition 17 - - 26-50%  VPA II Recognition 28 - - 51-75%       Visual Memory Process Score Summary  Process Score Raw Score Scaled Score Percentile Rank Cumulative Percentage (Base Rate)  VR II Recognition 2 - - 10-16%       ABILITY-MEMORY ANALYSIS  Ability Score:  GAI: 120 Date of Testing:  WAIS-IV; WMS-IV 2021/07/30  Predicted Difference Method   Index Predicted WMS-IV Index Score Actual WMS-IV Index Score Difference Critical Value  Significant Difference Y/N Base Rate  Auditory Memory 111 82 29 10.41 Y 1-2%  Visual Memory 112 54 58 7.89 Y <1%  Immediate Memory 113 81 32 9.97 Y <1%  Delayed Memory 112 63 49 12.33 Y <1%  Statistical significance (critical value) at the .01 level.         Feedback to Patient: Albert Pierce will return on 09/12/2021 for an interactive feedback session with Albert Pierce at which time his test performances, clinical impressions and treatment recommendations will be reviewed in detail. The patient understands he can contact our office should he require our assistance before this time.  120 minutes spent face-to-face with patient administering standardized tests, 30 minutes spent scoring Environmental education officer). [CPT [id number], [id number]]  Full report to follow.

## 2021-07-31 DIAGNOSIS — M109 Gout, unspecified: Secondary | ICD-10-CM | POA: Diagnosis not present

## 2021-07-31 DIAGNOSIS — I10 Essential (primary) hypertension: Secondary | ICD-10-CM | POA: Diagnosis not present

## 2021-07-31 DIAGNOSIS — E785 Hyperlipidemia, unspecified: Secondary | ICD-10-CM | POA: Diagnosis not present

## 2021-07-31 DIAGNOSIS — Z125 Encounter for screening for malignant neoplasm of prostate: Secondary | ICD-10-CM | POA: Diagnosis not present

## 2021-08-01 DIAGNOSIS — L570 Actinic keratosis: Secondary | ICD-10-CM | POA: Diagnosis not present

## 2021-08-01 DIAGNOSIS — Z85828 Personal history of other malignant neoplasm of skin: Secondary | ICD-10-CM | POA: Diagnosis not present

## 2021-08-01 DIAGNOSIS — D485 Neoplasm of uncertain behavior of skin: Secondary | ICD-10-CM | POA: Diagnosis not present

## 2021-08-01 DIAGNOSIS — Z1212 Encounter for screening for malignant neoplasm of rectum: Secondary | ICD-10-CM | POA: Diagnosis not present

## 2021-08-01 DIAGNOSIS — D2262 Melanocytic nevi of left upper limb, including shoulder: Secondary | ICD-10-CM | POA: Diagnosis not present

## 2021-08-01 DIAGNOSIS — D045 Carcinoma in situ of skin of trunk: Secondary | ICD-10-CM | POA: Diagnosis not present

## 2021-08-01 DIAGNOSIS — D044 Carcinoma in situ of skin of scalp and neck: Secondary | ICD-10-CM | POA: Diagnosis not present

## 2021-08-01 DIAGNOSIS — I10 Essential (primary) hypertension: Secondary | ICD-10-CM | POA: Diagnosis not present

## 2021-08-01 DIAGNOSIS — C4441 Basal cell carcinoma of skin of scalp and neck: Secondary | ICD-10-CM | POA: Diagnosis not present

## 2021-08-01 DIAGNOSIS — C44519 Basal cell carcinoma of skin of other part of trunk: Secondary | ICD-10-CM | POA: Diagnosis not present

## 2021-08-01 DIAGNOSIS — R82998 Other abnormal findings in urine: Secondary | ICD-10-CM | POA: Diagnosis not present

## 2021-08-01 DIAGNOSIS — Z85820 Personal history of malignant melanoma of skin: Secondary | ICD-10-CM | POA: Diagnosis not present

## 2021-08-01 DIAGNOSIS — D225 Melanocytic nevi of trunk: Secondary | ICD-10-CM | POA: Diagnosis not present

## 2021-08-01 DIAGNOSIS — L821 Other seborrheic keratosis: Secondary | ICD-10-CM | POA: Diagnosis not present

## 2021-08-01 DIAGNOSIS — D2261 Melanocytic nevi of right upper limb, including shoulder: Secondary | ICD-10-CM | POA: Diagnosis not present

## 2021-08-06 ENCOUNTER — Ambulatory Visit: Payer: Medicare Other | Admitting: Psychology

## 2021-08-07 DIAGNOSIS — N183 Chronic kidney disease, stage 3 unspecified: Secondary | ICD-10-CM | POA: Diagnosis not present

## 2021-08-07 DIAGNOSIS — G4733 Obstructive sleep apnea (adult) (pediatric): Secondary | ICD-10-CM | POA: Diagnosis not present

## 2021-08-07 DIAGNOSIS — Z1339 Encounter for screening examination for other mental health and behavioral disorders: Secondary | ICD-10-CM | POA: Diagnosis not present

## 2021-08-07 DIAGNOSIS — R413 Other amnesia: Secondary | ICD-10-CM | POA: Diagnosis not present

## 2021-08-07 DIAGNOSIS — J479 Bronchiectasis, uncomplicated: Secondary | ICD-10-CM | POA: Diagnosis not present

## 2021-08-07 DIAGNOSIS — M1990 Unspecified osteoarthritis, unspecified site: Secondary | ICD-10-CM | POA: Diagnosis not present

## 2021-08-07 DIAGNOSIS — R001 Bradycardia, unspecified: Secondary | ICD-10-CM | POA: Diagnosis not present

## 2021-08-07 DIAGNOSIS — I350 Nonrheumatic aortic (valve) stenosis: Secondary | ICD-10-CM | POA: Diagnosis not present

## 2021-08-07 DIAGNOSIS — I2510 Atherosclerotic heart disease of native coronary artery without angina pectoris: Secondary | ICD-10-CM | POA: Diagnosis not present

## 2021-08-07 DIAGNOSIS — Z1331 Encounter for screening for depression: Secondary | ICD-10-CM | POA: Diagnosis not present

## 2021-08-07 DIAGNOSIS — R634 Abnormal weight loss: Secondary | ICD-10-CM | POA: Diagnosis not present

## 2021-08-07 DIAGNOSIS — Z0000 Encounter for general adult medical examination without abnormal findings: Secondary | ICD-10-CM | POA: Diagnosis not present

## 2021-08-07 DIAGNOSIS — I129 Hypertensive chronic kidney disease with stage 1 through stage 4 chronic kidney disease, or unspecified chronic kidney disease: Secondary | ICD-10-CM | POA: Diagnosis not present

## 2021-08-07 DIAGNOSIS — E785 Hyperlipidemia, unspecified: Secondary | ICD-10-CM | POA: Diagnosis not present

## 2021-08-13 ENCOUNTER — Ambulatory Visit: Payer: Medicare Other

## 2021-08-14 ENCOUNTER — Other Ambulatory Visit: Payer: Self-pay

## 2021-08-14 ENCOUNTER — Encounter: Payer: Medicare Other | Attending: Psychology | Admitting: Psychology

## 2021-08-14 DIAGNOSIS — R251 Tremor, unspecified: Secondary | ICD-10-CM | POA: Diagnosis not present

## 2021-08-14 DIAGNOSIS — G20 Parkinson's disease: Secondary | ICD-10-CM | POA: Diagnosis not present

## 2021-08-14 DIAGNOSIS — R413 Other amnesia: Secondary | ICD-10-CM | POA: Insufficient documentation

## 2021-08-14 DIAGNOSIS — R4189 Other symptoms and signs involving cognitive functions and awareness: Secondary | ICD-10-CM | POA: Diagnosis not present

## 2021-08-14 DIAGNOSIS — G3184 Mild cognitive impairment, so stated: Secondary | ICD-10-CM | POA: Insufficient documentation

## 2021-08-14 DIAGNOSIS — G4733 Obstructive sleep apnea (adult) (pediatric): Secondary | ICD-10-CM | POA: Insufficient documentation

## 2021-08-14 NOTE — Progress Notes (Signed)
Neuropsychological Evaluation   Patient:  Albert Pierce   DOB: May 13, 1946  MR Number: [id number]  Location: [facility] [place] [facility] [place], [id number] [place] [address] Dept: [phone]  Start: 3 PM End: 4 PM  Provider/Observer:     Edgardo Roys PsyD  Chief Complaint:      Chief Complaint  Patient presents with   Memory Loss   Tremors    Reason For Service:      Albert Pierce is a 76 year old male referred for neuropsychological evaluation and is here for follow-up repeat testing after his initial evaluation and first follow-up repeat testing indicated significant memory deficits for both visual and auditory information that appeared to be progressive in nature.  There were also impairments in fine motor coordination and changes in attention and working memory as well.  This is a follow-up assessment to provide test retest analysis.  I will not go over previous clinical history as it is readily available in his EMR with complete history found in previous neuropsychologist work-up on 08/21/2020.  Today, the patient and his wife are present for the clinical interview and the patient did not elaborate on any observed changes or worsening in memory functions.  The patient's wife provided most of the information with the patient passively agreeing with what she primarily said.  The patient's wife reports that she observes a worsening in his memory and that his memory loss is more significant than it was the previous year.  This is primarily related to changes in his ability to learn new information and short-term memory function deficits.  She denies any appearance of progressive or changes in motor functioning and other than a stooped posture his gait was fairly appropriate today in the clinical interview and there was no signs or indications of tremors.  There is also  denied any hallucinations or delusional type symptoms.  Since our initial evaluation the patient has been diagnosed with severe obstructive sleep apnea and has been prescribed a CPAP which he has been using regularly with good compliance.  They did initially go directly to the nose pillow device aperture and he has not been tried on a full mask.  There is some concern about whether he is getting adequate pressures and there is a schedule time to return to the neurologist at [facility] to read his results from his CPAP device.  The patient's wife reports that there has been a lot of stress and changes over 2022.  They sold their home and bought at townhome that needed to be remodeled extensively.  During this time they lived with their daughter for 5 or 6 months and there was a lot of changes in activities.  They have just relocated into their townhome.  Tests Administered: Controlled Oral Word Association Test (COWAT; FAS & Animals)  [place] (BNT) Trail Making Test (TMT; Part A & B) Wechsler Adult Intelligence Scale, 4th Edition (WAIS-IV) Wechsler Memory Scale, 4th Edition (WMS-IV); Older Adult Battery   Participation Level:   Active  Participation Quality:  Appropriate      Behavioral Observation:  The patient appeared well-groomed and appropriately dressed. His manners were polite and appropriate to the situation. The patient demonstrated  a positive attitude toward testing and showed good effort.    The patient completed testing items at a quick pace and was able to complete all scheduled tests in one session.  Well  Groomed, Alert, and Appropriate.   Test Results:     2023:  COWAT FAS Total = 16 Z = -2.15 Animals Total = 15 Z = -0.76   2022                                       Raw Score      T-Score           Percentile        Description  COWAT             FAS:                33                    39                    14                    Average                          Rep.     4             Animals:          20                    49                    46                    Above Average    2021                                     Raw Score      T-Score           Percentile        Description    COWAT             FAS:                40                    46                    34                    Average             Animals:          27                    62                    88                    Above Average     Serial testing using both the FAS test and animal naming test do show some progressive changes and worsening in both lexical fluency as well as semantic fluency.  The patient has gone from average and above average performance to average and low average performance on these measures.  2023:  [place] Naming Test # Correct without assistance: 27 # Of stimulus cues given: 4 # Correct with stimulus cue: 0 # Of phonemic cues given: 4 # Correct with phonemic cue: 3    2022                                       Raw Score      T-Score           Percentile        Description  BNT             Total (w/Albert Pierce)    60/60                70                   98                    Superior     2021                                     Raw Score      T-Score           Percentile        Description  BNT             Total (w/ [facility])   60/60                70                   98                    Superior      On the [place] which was changed from the full [place] naming to the [employer] the patient continues to do quite well.  Previously the patient had correctly named 60 of 60 targets and performed in the superior range.  On the most recent assessment in 2023 the patient correctly named 27 of 30 targets and only required a few phonetic cues leading to 3 out of 4 correct responses given raising his total with performance to 30 of 30.  2023:  Trail Making Test Part A Time = 0:41.53 0 Errors Part B Time = 1:31.82 0  Errors   2022 TMT                                                      Raw                 T-Score           Percentile        Description             Part A                                      26"                   55  69                    Average                         Part B                                      50"                   62                    88                    Above Average *(Heaton et al., 2004)     2021 TMT                                                      Raw                 T-Score           Percentile        Description             Part A                                      28"                   55                    69                     Average                         Part B                                      63"                   57                    75                    Above Average *(Heaton et al., 2004)    On the Trail Making Test part a and B the patient has shown some degree of progressive changes but continues to function in the average range overall.  He had initially been in the high average range on measures of focus execute abilities and cognitive shifting.  However, while he is showing some progressive worsening on these measures his performances are still in the average range relative to a normative population.     WAIS-IV             Composite Score Summary       Scale Sum of Scaled Scores Composite Score Percentile Rank 95% Conf. Interval Qualitative Description  Verbal Comprehension 33 VCI 105 63 99-110 Average  Perceptual Reasoning 45 PRI 129 97 121-134 Superior  Working Memory 20 WMI 100 50 93-107 Average  Processing Speed 25 PSI 114 82 104-121 High Average  Full Scale 123 FSIQ 115 84 111-119 High Average  General Ability 78 GAI 120 91 114-124 Superior     2022:   Composite Score Summary 2022   Scale Sum of Scaled Scores Composite Score Percentile Rank 95% Conf. Interval Qualitative Description   Verbal Comprehension 35 VCI 108 70 102-113 Average  Perceptual Reasoning 47 PRI 133 99 125-138 Very Superior  Working Memory 15 WMI 86 18 80-94 Low Average  Processing Speed 29 PSI 124 95 113-130 Superior  Full Scale 126 FSIQ 117 87 113-121 High Average  General Ability 82 GAI 124 95 118-128 Superior     2021:  Composite Score Summary (2021)    Scale Sum of Scaled Scores Composite Score Percentile Rank 95% Conf. Interval Qualitative Description  Verbal Comprehension 39 VCI 116 86 110-121 High Average  Perceptual Reasoning 45 PRI 129 97 121-134 Superior  Working Memory 21 WMI 102 55 95-109 Average  Processing Speed 27 PSI 120 91 110-126 Superior  Full Scale 132 FSIQ 122 93 117-126 Superior  General Ability 84 GAI 126 96 120-130 Superior    The patient has been administered repeat Wechsler Adult Intelligence Scale-IV over the 3 testing episodes.  While there has been some subtle changes globally these have been minimal in nature.  On full-scale IQ score, representing a total composite score, the patient initially produced a full-scale IQ score of 122 in 2021.  In 2022 his total composite score was 117 and on the most recent evaluation his total composite score was 115.  There is been similar mild changes over time and his general abilities index scores as well.  However, these are very subtle and suggest that he is generally maintaining overall global cognitive functioning outside of memory functions.  2023:           Verbal Comprehension Subtests Summary 2023      Subtest Raw Score Scaled Score Percentile Rank Reference Group Scaled Score SEM  Similarities 25 11 63 10 0.95  Vocabulary 46 12 75 13 0.67  Information 15 10 50 11 0.73  (Comprehension) 22 10 50 9 1.27        Subtest 2022 Raw Score Scaled Score Percentile Rank  Similarities 26 11 63  Vocabulary 48 13 84  Information 17 11 63     Subtest 2021 Raw Score Scaled Score Percentile Rank  Similarities 32 15 95   Vocabulary 49 13 84  Information 17 11 63               Perceptual Reasoning Subtests Summary 2023      Subtest Raw Score Scaled Score Percentile Rank Reference Group Scaled Score SEM  Block Design 46 14 91 10 0.99  Matrix Reasoning 16 12 75 8 0.90  Visual Puzzles 22 19 99.9 14 0.99  (Picture Completion) 6 7 16 5  0.99        Subtest (2022) Raw Score Scaled Score Percentile Rank  Block Design 45 14 91  Matrix Reasoning 21 15 95  Visual Puzzles 21 18 99.6   Subtest (2021) Raw Score Scaled Score Percentile Rank  Block Design 47 14 91  Matrix Reasoning 22 16 98  Visual Puzzles 17 15 95    2023:           Working  Memory Subtests Summary 2023     Subtest Raw Score Scaled Score Percentile Rank Reference Group Scaled Score SEM  Digit Span 24 10 50 8 0.73  Arithmetic 13 10 50 9 1.20      2022:  Subtest Raw Score Scaled Score Percentile Rank  Digit Span 17 6 9   Arithmetic 12 9 37     2021:  Subtest  Raw Score Scaled Score Percentile Rank  Digit Span 22 9 37  Arithmetic 15 12 75                 Processing Speed Subtests Summary 2023     Subtest Raw Score Scaled Score Percentile Rank Reference Group Scaled Score SEM  Symbol Search 27 11 63 8 1.12  Coding 71 14 91 10 1.12        Subtest 2022 Raw Score Scaled Score Percentile Rank  Symbol Search 36 15 95  Coding 70 14 91      Subtest 2021 Raw Score Scaled Score Percentile Rank  Symbol Search 31 13 84  Coding 70 14 91       WMS - Older Adult            Index Score Summary 2023       Index Sum of Scaled Scores Index Score Percentile Rank 95% Confidence Interval Qualitative Descriptor  Auditory Memory (AMI) 28 82 12 77-89 Low Average  Visual Memory (VMI) 5 54 0.1 50-60 Extremely Low  Immediate Memory (IMI) 21 81 10 76-88 Low Average  Delayed Memory (DMI) 12 63 1 58-74 Extremely Low      Index Score Summary 2022   Index Sum of Scaled Scores Index Score Percentile Rank 95% Confidence Interval  Qualitative Descriptor  Auditory Memory (AMI) 24 77 6 72-84 Borderline  Visual Memory (VMI) 11 76 5 72-82 Borderline  Immediate Memory (IMI) 27 93 32 87-100 Average  Delayed Memory (DMI) 8 54 0.1 50-65 Extremely Low     Index Score Summary 2021    Index Sum of Scaled Scores Index Score Percentile Rank 95% Confidence Interval Qualitative Descriptor  Auditory Memory (AMI) 33 90 25 84-97 Average  Visual Memory (VMI) 7 62 1 58-68 Extremely Low  Immediate Memory (IMI) 27 93 32 87-100 Average  Delayed Memory (DMI) 13 65 1 60-75 Extremely Low   The patient has been administered the Wechsler Adult Intelligence Scale-IV/3 successive sessions in 2021, 2022 and 2023.  The patient has shown some improvement in his most recent assessment of auditory memory functions but still shows a decline from his initial assessment in 2021.  He has shown progressive and significant decline in visual memory after initial improvement between 2021 and 2022.  The patient's clear focal memory deficits are primarily visual in nature but the patient does show decline in auditory memory functions particularly from predicted levels of premorbid functioning.  Immediate memory has shown decline with the majority of this decline related to visual memory deficits.  The patient has shown very little change over time and delayed memory functions although they are significantly below premorbid estimates and are significantly impaired.  The patient continues to show significant improvement under recognition formats.  Overall, there is very little change in memory functions between the initial assessment and the current assessment indicating no progressive decline but there are significant deficits noted in memory and learning consistent with subjective reports.   All scores below are from 2023:  2022 and 2021 can be found in 10/09/2020 report.  Primary Subtest Scaled Score Summary      Subtest Domain Raw Score Scaled Score  Percentile Rank  Logical Memory I AM 22 7 16   Logical Memory II AM 3 3 1   Verbal Paired Associates I AM 20 10 50  Verbal Paired Associates II AM 4 8 25   Visual Reproduction I VM 18 4 2   Visual Reproduction II VM 0 1 0.1  Symbol Span VWM 13 8 25                   Auditory Memory Process Score Summary      Process Score Raw Score Scaled Score Percentile Rank Cumulative Percentage (Base Rate)  LM II Recognition 17 - - 26-50%  VPA II Recognition 28 - - 51-75%                   Visual Memory Process Score Summary      Process Score Raw Score Scaled Score Percentile Rank Cumulative Percentage (Base Rate)  VR II Recognition 2 - - 10-16%            ABILITY-MEMORY ANALYSIS   Ability Score:    GAI: 120 Date of Testing:           WAIS-IV; WMS-IV 2021/07/30             Predicted Difference Method    Index Predicted WMS-IV Index Score Actual WMS-IV Index Score Difference Critical Value   Significant Difference Y/N Base Rate  Auditory Memory 111 82 29 10.41 Y 1-2%  Visual Memory 112 54 58 7.89 Y <1%  Immediate Memory 113 81 32 9.97 Y <1%  Delayed Memory 112 63 49 12.33 Y <1%  Statistical significance (critical value) at the .01 level.        Summary of Results:   The results of the current neuropsychological evaluation do show some areas of decline, some areas have improvement in some areas of the stability as a function of time.  The patient is shown some decrease in verbal fluency and expressive language functioning's with decreases in both lexical fluency and somatic fluency.  However, he has gone from average performance relative to a normative population to low average with only mild decreases over time.  The patient is showing some mild decreases in focus execute abilities and and aspects of flexibility and shifting of attention.  There is been little to no change in global cognitive functioning over time.  He has shown some very mild but statistically insignificant  changes in global functioning over the past year.  While there were initial changes in verbal comprehension abilities between the initial assessment and the assessment in 2022 his performances generally stayed steady and the only change over time has been a reduction in verbal reasoning scores with no change between 2022 and 2023 in this aspect.  These verbal reasoning scores are still in the average range but do represent lower performance than predictions based on premorbid estimates.  The patient continues to perform in the superior range on measures of visual spatial, visual reasoning, visual problem-solving and visual constructional abilities.  There is been no change in these measures over the past 3 years.  The patient is actually shown a significant improvement in working memory/auditory encoding and visual encoding components over the past year.  While initial assessment and 2021 produced a working memory index score of 102 which is in the average range and in 2022 produced a working memory index score of 86 suggesting significant decline  in auditory encoding capacity.  However, on the most recent assessment his working memory index score improved to a 100 standard score.  This is essentially commensurate with his performance in 2021.  I do think that this area of improvement is likely directly related to effectively treating his severe sleep apnea.  While the patient has shown some variability over time on his visual scanning, visual searching and overall speed of mental operations he continues to do very well on this component and he is roughly performing in the same level that he was back in 2021 currently.  As memory functions was the primary area identified on the initial and subsequent assessment and is the primary subjective deficits reported by patient and his wife we repeated the full Weschler memory scales for comparisons.  While the patient continues to show significant memory deficits  especially comparing his current and recent memory functions to premorbid estimates where he was likely in the high average to superior range relative to a normative population his memory performances have generally stayed stable with the exception of his visual memory performance.  The patient is showing severe visual memory deficits and while he has clear mild to moderate auditory memory deficits from premorbid levels of functioning they are generally staying stable and in fact have improved between 2022 and 2023.  The current decline in immediate memory is explained almost exclusively by further deterioration and worsening in visual memory functions.  As noted on previous assessments recognition and cued recall formats does produce significant help and improvement in functioning but there still remain significant memory deficits although they are generally stable across the past 3 years.  The areas of improvement are likely reflecting significant benefits of his effective treatment of his underlying sleep apnea.  Impression/Diagnosis:   The results of the current neuropsychological evaluation with serial testing over the past 3 years do continue to show significant memory deficits for both visual and auditory information.  He has shown some progression with visual memory deficits but they both are significantly below predicted levels of premorbid functioning.  The patient has shown some areas of improvement over the past year with primary improvements in the areas of working memory/encoding capacity where he is now returned to essentially the level of functioning in 2021.  The patient's auditory memory is also improved relative to the 2022 evaluation but did not improve to the level of the 2021 they do continue to be significant memory deficits noted.  The patient has generally remained stable on the vast majority of cognitive functioning.  There has been a slight reduction in verbal fluency and lexical and  semantic fluency but they remain in the average to low average range relative to a normative population.  There is been some mild further reduction with regard to verbal reasoning and visual reasoning as well as cognitive flexibility but these continue to be in the average or high average range relative to a normative population there is only very slight reduction in global cognitive functioning outside of memory functioning.  The patient continues to do very well on measures of information processing speed and visual spatial and visual visual perceptual components.  The patient also continues to have some fine motor coordination issues.  It is clear that the patient has made improvements in areas that would be most sensitive to the impact of significant obstructive sleep apnea and now that he is using a CPAP device effectively it would explain some of the areas of improvement he has  made over the past 3 years.  Other than the significant and focal deficits and memory and learning there did not appear to be other degenerative processes.  While it may be clouded as far as interpretation of some degree with effective treatment of severe obstructive sleep apnea there does not appear to be any general progression in his deficits and in fact some areas he has improved.  The pattern of deficits and change over time are not consistent with aspects such as Lewy body dementia, Parkinson's or other cortical or subcortical mediated processes.  The majority of his deficits appear to be focal to both right and left temporal lobes consistent with findings of 2021 MRI.  There does not appear to be any specific parietal lobe involvement and only minimal changes in information processing speed consistent with minimal chronic microvascular ischemic changes.  As far as etiological and diagnostic criterion this does appear to be stable but focal impacts with his right and left temporal lobe functioning.  Little if any frontal or  parietal lobe involvement is noted in either DaTscan's, MRI or neuropsychological testing.  We will leave a diagnosis of mild cognitive impairments with specific memory loss as the focal area.  While his memory loss is significant the diagnosis of mild cognitive impairments has to do with only limited specific areas of cognitive deficits and cognitive change.  He does have significant memory impairments but they are generally stable with recent improvements in encoding capacity and auditory memory with progressive changes in visual memory functions.  I will sit down with the patient and go over recommendations.  They have made significant changes in their life and are now settling into their townhome which will allow for more focused efforts on their day-to-day activities and not maintaining a home outside of general maintenance.  I do not think we will need any scheduled repeat testing unless there are noted significant changes in cognitive functioning in the future.  Diagnosis:    Mild cognitive impairment with memory loss  Severe obstructive sleep apnea  Cognitive changes  Parkinsonism, unspecified Parkinsonism type (HCC)  Tremor  Memory loss   _____________________ Ilean Skill, Psy.D. Clinical Neuropsychologist

## 2021-08-16 DIAGNOSIS — G4733 Obstructive sleep apnea (adult) (pediatric): Secondary | ICD-10-CM | POA: Diagnosis not present

## 2021-08-23 ENCOUNTER — Other Ambulatory Visit: Payer: Self-pay

## 2021-08-23 ENCOUNTER — Ambulatory Visit (INDEPENDENT_AMBULATORY_CARE_PROVIDER_SITE_OTHER): Payer: Medicare Other | Admitting: Internal Medicine

## 2021-08-23 DIAGNOSIS — J479 Bronchiectasis, uncomplicated: Secondary | ICD-10-CM

## 2021-08-23 LAB — PULMONARY FUNCTION TEST
DL/VA % pred: 105 %
DL/VA: 4.23 ml/min/mmHg/L
DLCO cor % pred: 101 %
DLCO cor: 24.22 ml/min/mmHg
DLCO unc % pred: 101 %
DLCO unc: 24.22 ml/min/mmHg
FEF 25-75 Post: 5.51 L/sec
FEF 25-75 Pre: 4.93 L/sec
FEF2575-%Change-Post: 11 %
FEF2575-%Pred-Post: 263 %
FEF2575-%Pred-Pre: 235 %
FEV1-%Change-Post: 1 %
FEV1-%Pred-Post: 127 %
FEV1-%Pred-Pre: 125 %
FEV1-Post: 3.63 L
FEV1-Pre: 3.59 L
FEV1FVC-%Change-Post: 4 %
FEV1FVC-%Pred-Pre: 117 %
FEV6-%Change-Post: -3 %
FEV6-%Pred-Post: 109 %
FEV6-%Pred-Pre: 113 %
FEV6-Post: 4.05 L
FEV6-Pre: 4.18 L
FEV6FVC-%Change-Post: 0 %
FEV6FVC-%Pred-Post: 106 %
FEV6FVC-%Pred-Pre: 106 %
FVC-%Change-Post: -3 %
FVC-%Pred-Post: 102 %
FVC-%Pred-Pre: 106 %
FVC-Post: 4.05 L
FVC-Pre: 4.2 L
Post FEV1/FVC ratio: 90 %
Post FEV6/FVC ratio: 100 %
Pre FEV1/FVC ratio: 85 %
Pre FEV6/FVC Ratio: 100 %
RV % pred: 94 %
RV: 2.3 L
TLC % pred: 100 %
TLC: 6.66 L

## 2021-08-23 NOTE — Progress Notes (Signed)
PFT done today. 

## 2021-08-28 ENCOUNTER — Ambulatory Visit: Payer: Medicare Other | Admitting: Psychology

## 2021-09-12 ENCOUNTER — Other Ambulatory Visit: Payer: Self-pay

## 2021-09-12 ENCOUNTER — Encounter: Payer: Self-pay | Admitting: Psychology

## 2021-09-12 ENCOUNTER — Encounter: Payer: Medicare Other | Attending: Psychology | Admitting: Psychology

## 2021-09-12 DIAGNOSIS — R4189 Other symptoms and signs involving cognitive functions and awareness: Secondary | ICD-10-CM | POA: Diagnosis not present

## 2021-09-12 DIAGNOSIS — R251 Tremor, unspecified: Secondary | ICD-10-CM | POA: Diagnosis not present

## 2021-09-12 DIAGNOSIS — R413 Other amnesia: Secondary | ICD-10-CM | POA: Insufficient documentation

## 2021-09-12 DIAGNOSIS — G4733 Obstructive sleep apnea (adult) (pediatric): Secondary | ICD-10-CM | POA: Insufficient documentation

## 2021-09-12 DIAGNOSIS — G3184 Mild cognitive impairment, so stated: Secondary | ICD-10-CM | POA: Diagnosis not present

## 2021-09-12 DIAGNOSIS — G20 Parkinson's disease: Secondary | ICD-10-CM | POA: Diagnosis not present

## 2021-09-12 NOTE — Progress Notes (Signed)
09/12/2020 8 AM-9 AM: ? ?Today's visit was an in person visit that was conducted in my outpatient clinic office.  The patient and his wife are present for this feedback session where we reviewed the results of the recent neuropsychological evaluation.  The patient is now had 3 successive assessments for serial testing in 2021, 2022 and now 2023.  Today's visit highlighted the positive nature of the serial testing.  While the patient does clearly have some significant memory deficits that are sustained and continuing to be well below premorbid predictions of functioning and subjectively reported by both the patient and his wife's significant there does not appear to be any general loss of cognitive functioning outside of these memory deficits.  The patient retains very good cognitive function number of areas and there does not appear to be any specific changes in frontal, parietal or occipital brain regions.  The focus of deficits are almost exclusively to memory and specific temporal lobe functioning primarily.  The patient has been successfully treating his obstructive sleep apnea and the expected improvements in cognitive functioning around encoding variables and attentional variables have been achieved.  The patient generally remained stable with some areas of improvement in some areas of mild decrease noted.  I have included a copy of the impression/summary from the formal neuropsychological evaluation below for convenience. ? ? ? ?Impression/Diagnosis:   The results of the current neuropsychological evaluation with serial testing over the past 3 years do continue to show significant memory deficits for both visual and auditory information.  He has shown some progression with visual memory deficits but they both are significantly below predicted levels of premorbid functioning.  The patient has shown some areas of improvement over the past year with primary improvements in the areas of working memory/encoding  capacity where he is now returned to essentially the level of functioning in 2021.  The patient's auditory memory is also improved relative to the 2022 evaluation but did not improve to the level of the 2021 they do continue to be significant memory deficits noted.  The patient has generally remained stable on the vast majority of cognitive functioning.  There has been a slight reduction in verbal fluency and lexical and semantic fluency but they remain in the average to low average range relative to a normative population.  There is been some mild further reduction with regard to verbal reasoning and visual reasoning as well as cognitive flexibility but these continue to be in the average or high average range relative to a normative population there is only very slight reduction in global cognitive functioning outside of memory functioning.  The patient continues to do very well on measures of information processing speed and visual spatial and visual visual perceptual components.  The patient also continues to have some fine motor coordination issues.  It is clear that the patient has made improvements in areas that would be most sensitive to the impact of significant obstructive sleep apnea and now that he is using a CPAP device effectively it would explain some of the areas of improvement he has made over the past 3 years.  Other than the significant and focal deficits and memory and learning there did not appear to be other degenerative processes.  While it may be clouded as far as interpretation of some degree with effective treatment of severe obstructive sleep apnea there does not appear to be any general progression in his deficits and in fact some areas he has improved.  The pattern  of deficits and change over time are not consistent with aspects such as Lewy body dementia, Parkinson's or other cortical or subcortical mediated processes.  The majority of his deficits appear to be focal to both right and  left temporal lobes consistent with findings of 2021 MRI.  There does not appear to be any specific parietal lobe involvement and only minimal changes in information processing speed consistent with minimal chronic microvascular ischemic changes. ? ?As far as etiological and diagnostic criterion this does appear to be stable but focal impacts with his right and left temporal lobe functioning.  Little if any frontal or parietal lobe involvement is noted in either DaTscan's, MRI or neuropsychological testing.  We will leave a diagnosis of mild cognitive impairments with specific memory loss as the focal area.  While his memory loss is significant the diagnosis of mild cognitive impairments has to do with only limited specific areas of cognitive deficits and cognitive change.  He does have significant memory impairments but they are generally stable with recent improvements in encoding capacity and auditory memory with progressive changes in visual memory functions. ? ?I will sit down with the patient and go over recommendations.  They have made significant changes in their life and are now settling into their townhome which will allow for more focused efforts on their day-to-day activities and not maintaining a home outside of general maintenance.  I do not think we will need any scheduled repeat testing unless there are noted significant changes in cognitive functioning in the future. ? ?Diagnosis:   ? ?Mild cognitive impairment with memory loss ? ?Severe obstructive sleep apnea ? ?Cognitive changes ? ?Parkinsonism, unspecified Parkinsonism type ([place]) ? ?Tremor ? ?Memory loss ? ? ?_____________________ ?Ilean Skill, Psy.D. ?Clinical Neuropsychologist ?

## 2021-09-18 DIAGNOSIS — Z20822 Contact with and (suspected) exposure to covid-19: Secondary | ICD-10-CM | POA: Diagnosis not present

## 2021-10-04 DIAGNOSIS — Z20822 Contact with and (suspected) exposure to covid-19: Secondary | ICD-10-CM | POA: Diagnosis not present

## 2021-10-12 DIAGNOSIS — Z20822 Contact with and (suspected) exposure to covid-19: Secondary | ICD-10-CM | POA: Diagnosis not present

## 2021-10-14 DIAGNOSIS — Z20822 Contact with and (suspected) exposure to covid-19: Secondary | ICD-10-CM | POA: Diagnosis not present

## 2021-11-11 DIAGNOSIS — Z20822 Contact with and (suspected) exposure to covid-19: Secondary | ICD-10-CM | POA: Diagnosis not present

## 2021-11-12 DIAGNOSIS — Z9989 Dependence on other enabling machines and devices: Secondary | ICD-10-CM | POA: Diagnosis not present

## 2021-11-12 DIAGNOSIS — R413 Other amnesia: Secondary | ICD-10-CM | POA: Diagnosis not present

## 2021-11-12 DIAGNOSIS — G3184 Mild cognitive impairment, so stated: Secondary | ICD-10-CM | POA: Diagnosis not present

## 2021-11-12 DIAGNOSIS — Z79899 Other long term (current) drug therapy: Secondary | ICD-10-CM | POA: Diagnosis not present

## 2021-11-12 DIAGNOSIS — G4733 Obstructive sleep apnea (adult) (pediatric): Secondary | ICD-10-CM | POA: Diagnosis not present

## 2022-01-08 ENCOUNTER — Other Ambulatory Visit: Payer: Self-pay | Admitting: Cardiology

## 2022-01-29 DIAGNOSIS — D2272 Melanocytic nevi of left lower limb, including hip: Secondary | ICD-10-CM | POA: Diagnosis not present

## 2022-01-29 DIAGNOSIS — L821 Other seborrheic keratosis: Secondary | ICD-10-CM | POA: Diagnosis not present

## 2022-01-29 DIAGNOSIS — D225 Melanocytic nevi of trunk: Secondary | ICD-10-CM | POA: Diagnosis not present

## 2022-01-29 DIAGNOSIS — Z85828 Personal history of other malignant neoplasm of skin: Secondary | ICD-10-CM | POA: Diagnosis not present

## 2022-01-29 DIAGNOSIS — D2261 Melanocytic nevi of right upper limb, including shoulder: Secondary | ICD-10-CM | POA: Diagnosis not present

## 2022-01-29 DIAGNOSIS — D2262 Melanocytic nevi of left upper limb, including shoulder: Secondary | ICD-10-CM | POA: Diagnosis not present

## 2022-01-29 DIAGNOSIS — Z85820 Personal history of malignant melanoma of skin: Secondary | ICD-10-CM | POA: Diagnosis not present

## 2022-01-29 DIAGNOSIS — D2271 Melanocytic nevi of right lower limb, including hip: Secondary | ICD-10-CM | POA: Diagnosis not present

## 2022-02-10 DIAGNOSIS — I129 Hypertensive chronic kidney disease with stage 1 through stage 4 chronic kidney disease, or unspecified chronic kidney disease: Secondary | ICD-10-CM | POA: Diagnosis not present

## 2022-02-10 DIAGNOSIS — J479 Bronchiectasis, uncomplicated: Secondary | ICD-10-CM | POA: Diagnosis not present

## 2022-02-10 DIAGNOSIS — G4733 Obstructive sleep apnea (adult) (pediatric): Secondary | ICD-10-CM | POA: Diagnosis not present

## 2022-02-10 DIAGNOSIS — E785 Hyperlipidemia, unspecified: Secondary | ICD-10-CM | POA: Diagnosis not present

## 2022-02-10 DIAGNOSIS — R634 Abnormal weight loss: Secondary | ICD-10-CM | POA: Diagnosis not present

## 2022-02-10 DIAGNOSIS — M1990 Unspecified osteoarthritis, unspecified site: Secondary | ICD-10-CM | POA: Diagnosis not present

## 2022-02-10 DIAGNOSIS — R413 Other amnesia: Secondary | ICD-10-CM | POA: Diagnosis not present

## 2022-02-10 DIAGNOSIS — N183 Chronic kidney disease, stage 3 unspecified: Secondary | ICD-10-CM | POA: Diagnosis not present

## 2022-02-10 DIAGNOSIS — I2510 Atherosclerotic heart disease of native coronary artery without angina pectoris: Secondary | ICD-10-CM | POA: Diagnosis not present

## 2022-02-10 DIAGNOSIS — R001 Bradycardia, unspecified: Secondary | ICD-10-CM | POA: Diagnosis not present

## 2022-02-10 DIAGNOSIS — I350 Nonrheumatic aortic (valve) stenosis: Secondary | ICD-10-CM | POA: Diagnosis not present

## 2022-02-14 DIAGNOSIS — G4733 Obstructive sleep apnea (adult) (pediatric): Secondary | ICD-10-CM | POA: Diagnosis not present

## 2022-02-19 DIAGNOSIS — G3184 Mild cognitive impairment, so stated: Secondary | ICD-10-CM | POA: Diagnosis not present

## 2022-03-03 NOTE — Progress Notes (Unsigned)
Cardiology Office Note:    Date:  03/04/2022   ID:  Albert Pierce, DOB 02/03/1946, MRN [id number]  PCP:  Prince Solian, MD  Cardiologist:  None  Electrophysiologist:  None   Referring MD: Prince Solian, MD   Chief Complaint  Patient presents with   Bradycardia     History of Present Illness:    Albert Pierce is a 76 y.o. male with a hx of hypertension, mild aortic stenosis who presents for follow-up.  He was referred by Caprice Beaver, PA for evaluation of bradycardia, initially seen on 01/25/2020.  He was noted at clinic visit to have sinus bradycardia with rate 46.  He brought a log of his BP/pulse with him, and pulse has been as low as 39 at home.  Current medications include atenolol 25 mg daily, amlodipine 10 mg daily, and telmisartan 80 mg daily.  He denies any lightheadedness, syncope, fatigue, chest pain, dyspnea, or palpitations.  States that he exercises by walking his dog about 4 times per week for 20 to 30 minutes.  At initial clinic on 01/25/2020, and Tylenol was discontinued.  Echocardiogram on 02/13/2020 showed normal biventricular function, moderate focal basal septal LVH, grade 1 diastolic dysfunction, mild aortic stenosis, mild dilatation of the ascending aorta measuring 3 mm.  Zio patch x3 days on 02/28/2020 showed no significant arrhythmia, average heart rate 60 bpm.  Cardiac MRI on 04/04/2020 showed asymmetric hypertrophy measuring up to 16 mm and basal septum (8 mm and posterior wall), meeting criteria for HCM but patient's AS and hypertension could also be causes of asymmetric hypertrophy; no LGE, LVEF 72%, RVEF 63%, mild AI (regurgitant fraction 14%), ascending aortic dilatation measuring 40 mm.  Calcium score 1450 on 03/08/2021 (87th percentile); also noted to have bronchiectasis, referred to pulmonology.  MRA chest on 04/11/2021 showed ascending aortic dilatation measuring 40 mm.  Lexiscan Myoview on 07/25/2021 showed normal perfusion, EF 54%.  Since last clinic  visit, he reports that he has been doing well.  Denies any chest pain, dyspnea, lightheadedness, syncope, lower extremity edema, or palpitations.  Daughter does report he has been sleeping more than usual and may be more fatigued.   Past Medical History:  Diagnosis Date   Appendicitis    Arthritis    Gout    Hyperlipidemia    Hypertension    Short-term memory loss 2019   mild    Past Surgical History:  Procedure Laterality Date   APPENDECTOMY     76 y.o.   COLONOSCOPY     EYE MUSCLE SURGERY     UMBILICAL HERNIA REPAIR  2016   Dr Dalbert Batman    Current Medications: Current Meds  Medication Sig   amLODipine (NORVASC) 10 MG tablet Take 10 mg by mouth daily. Take 1 Tablets by mouth Daily   aspirin 81 MG tablet Take 81 mg by mouth daily.   Cyanocobalamin (VITAMIN B12) 1000 MCG TBCR Take 2,000 mcg by mouth daily.   donepezil (ARICEPT) 10 MG tablet Take one pill every morning with breakfast   fish oil-omega-3 fatty acids 1000 MG capsule Take 3,000 mg by mouth daily.   OVER THE COUNTER MEDICATION Take 50 mg by mouth daily. Cerebra 50 mg daily   rosuvastatin (CRESTOR) 20 MG tablet TAKE 1 TABLET EVERY DAY   Turmeric 500 MG TABS Take by mouth.   valsartan (DIOVAN) 320 MG tablet Take 1 tablet (320 mg total) by mouth daily.   VITAMIN E PO Take 180 mg by mouth daily.  Allergies:   Patient has no known allergies.   Social History   Socioeconomic History   Marital status: Married    Spouse name: Not on file   Number of children: 3   Years of education: 23   Highest education level: Professional school degree (e.g., MD, DDS, DVM, JD)  Occupational History   Not on file  Tobacco Use   Smoking status: Never   Smokeless tobacco: Never  Substance and Sexual Activity   Alcohol use: Yes    Alcohol/week: 2.0 - 8.0 standard drinks of alcohol    Types: 2 - 8 Standard drinks or equivalent per week    Comment: generally wine, occ beer or hard liquor    Drug use: No   Sexual activity:  Not on file  Other Topics Concern   Not on file  Social History Narrative   Lives at home with wife   Right handed   Caffeine: diet coke, 2-4 cups/day   Social Determinants of Health   Financial Resource Strain: Not on file  Food Insecurity: Not on file  Transportation Needs: Not on file  Physical Activity: Not on file  Stress: Not on file  Social Connections: Not on file     Family History: The patient's family history includes Parkinson's disease in his maternal aunt, maternal uncle, and mother; Tuberculosis in his maternal grandfather and paternal grandfather. There is no history of Dementia, Alzheimer's disease, or Lung cancer.  ROS:   Please see the history of present illness.    All other systems reviewed and are negative.  EKGs/Labs/Other Studies Reviewed:    The following studies were reviewed today:  EKG:  03/04/22: Sinus bradycardia, rate 39, left axis deviation 06/10/21:sinus bradycardia, rate 54, LAD 12/12/2020- The EKG ordered demonstrates sinus bradycardia, rate 51, Q waves in lead 1 and 2  04/19/2020- The EKG ordered demonstrates sinus rhythm, rate 51, left axis deviation, nonspecific T wave flattening  Recent Labs: 06/10/2021: BUN 17; Creatinine, Ser 0.95; Potassium 4.1; Sodium 143  Recent Lipid Panel    Component Value Date/Time   CHOL 135 06/10/2021 0944   TRIG 81 06/10/2021 0944   TRIG 264 (HH) 05/27/2006 0848   HDL 50 06/10/2021 0944   CHOLHDL 2.7 06/10/2021 0944   CHOLHDL 5 02/14/2014 0923   VLDL 26.4 02/14/2014 0923   LDLCALC 69 06/10/2021 0944   LDLDIRECT 79.1 04/17/2009 0819    Physical Exam:    VS:  BP 124/66   Ht '5\' 10"'$  (1.778 m)   Wt 175 lb 9.6 oz (79.7 kg)   BMI 25.20 kg/m     Wt Readings from Last 3 Encounters:  03/04/22 175 lb 9.6 oz (79.7 kg)  07/25/21 186 lb (84.4 kg)  06/13/21 186 lb (84.4 kg)     GEN:  in no acute distress  HEENT: Normal NECK: No JVD; No carotid bruits LYMPHATICS: No lymphadenopathy CARDIAC: regular,  bradycardic, 2/6 systolic murmur RESPIRATORY:  Clear to auscultation without rales, wheezing or rhonchi  ABDOMEN: Soft, non-tender, non-distended MUSCULOSKELETAL:  No edema; No deformity  SKIN: Warm and dry NEUROLOGIC:  Alert and oriented x 3 PSYCHIATRIC:  Normal affect   ASSESSMENT:    1. Bradycardia   2. Coronary artery disease involving native coronary artery of native heart without angina pectoris   3. LVH (left ventricular hypertrophy)   4. Aortic valve stenosis, etiology of cardiac valve disease unspecified   5. Aortic dilatation (HCC)   6. Hyperlipidemia, unspecified hyperlipidemia type      PLAN:  Sinus bradycardia: Heart rate as low as high 30s at home at initial clinic visit in 01/2020.   Discontinued atenolol. Zio patch 02/28/2020 after discontinuing atenolol shows no significant bradyarrhythmia, average heart rate 60 bpm. -Marked sinus bradycardia in clinic today, heart rate in high 30s.  No lightheadedness or syncope.  Daughter does report that he has been sleeping more and may be more fatigued.  Will refer to EP for pacemaker evaluation  CAD: Calcium score 1450 on 03/08/2021 (87th percentile).  Denies any chest pain.  Does report mild dyspnea.  Lexiscan Myoview on 07/25/2021 showed normal perfusion, EF 54%. -Continue aspirin 81 mg daily -Continue rosuvastatin 20 mg daily  LVH: Echocardiogram on 02/13/2020 showed moderate focal basal septal LVH.  Cardiac MRI on 04/04/2020 showed asymmetric hypertrophy measuring up to 16 mm and basal septum (8 mm and posterior wall), meeting criteria for HCM.  However the patient's AS and hypertension could also be causes of asymmetric hypertrophy.  No LGE on MRI.  Hypertension: On amlodipine 10 mg daily and valsartan 320 mg daily.  Appears controlled.  Aortic stenosis: Echocardiogram on 02/13/2020 showed mild AS, mild AI. Mild AI on CMR 04/04/2020.  Will monitor with repeat echocardiogram  Aortic dilatation: Ascending aorta measured 41 mm on  echo on 05/2018.  MRA on 04/04/2020 showed ascending aorta measured 40 mm.  MRA chest on 04/11/2021 showed ascending aortic dilatation measuring 40 mm.  Hyperlipidemia: On rosuvastatin 10 mg daily,  LDL 89 07/16/20.  Calcium score 1450 on 03/08/2021 (87th percentile), rosuvastatin increased to 20 mg daily.  LDL 69 on 06/10/2021  Bronchiectasis: Noted on calcium score, referred to pulmonology.  OSA: Reports compliance with CPAP  RTC in 6 months     Medication Adjustments/Labs and Tests Ordered: Current medicines are reviewed at length with the patient today.  Concerns regarding medicines are outlined above.  Orders Placed This Encounter  Procedures   Ambulatory referral to Cardiac Electrophysiology   EKG 12-Lead   ECHOCARDIOGRAM COMPLETE    No orders of the defined types were placed in this encounter.    Patient Instructions  Medication Instructions:  Continue same medications *If you need a refill on your cardiac medications before your next appointment, please call your pharmacy*   Lab Work: None ordered   Testing/Procedures:  Echo   Follow-Up: At [facility], you and your health needs are our priority.  As part of our continuing mission to provide you with exceptional heart care, we have created designated Provider Care Teams.  These Care Teams include your primary Cardiologist (physician) and Advanced Practice Providers (APPs -  Physician Assistants and Nurse Practitioners) who all work together to provide you with the care you need, when you need it.  We recommend signing up for the patient portal called "MyChart".  Sign up information is provided on this After Visit Summary.  MyChart is used to connect with patients for Virtual Visits (Telemedicine).  Patients are able to view lab/test results, encounter notes, upcoming appointments, etc.  Non-urgent messages can be sent to your provider as well.   To learn more about what you can do with MyChart, go to  [email or web address].      Your next appointment:  6 months   Call in Oct to schedule Feb appointment     The format for your next appointment:     Provider:  Dr.Cairo Agostinelli   EP  appointment scheduled with Dr.Taylor Wed 9/20 at 2:15 pm [facility] office  Important Information About Sugar  Signed, Donato Heinz, MD  03/04/2022 11:00 AM    [facility]

## 2022-03-04 ENCOUNTER — Ambulatory Visit (INDEPENDENT_AMBULATORY_CARE_PROVIDER_SITE_OTHER): Payer: Medicare Other | Admitting: Cardiology

## 2022-03-04 VITALS — BP 124/66 | Ht 70.0 in | Wt 175.6 lb

## 2022-03-04 DIAGNOSIS — I350 Nonrheumatic aortic (valve) stenosis: Secondary | ICD-10-CM

## 2022-03-04 DIAGNOSIS — I77819 Aortic ectasia, unspecified site: Secondary | ICD-10-CM

## 2022-03-04 DIAGNOSIS — E785 Hyperlipidemia, unspecified: Secondary | ICD-10-CM | POA: Diagnosis not present

## 2022-03-04 DIAGNOSIS — I517 Cardiomegaly: Secondary | ICD-10-CM | POA: Diagnosis not present

## 2022-03-04 DIAGNOSIS — R001 Bradycardia, unspecified: Secondary | ICD-10-CM | POA: Diagnosis not present

## 2022-03-04 DIAGNOSIS — I2510 Atherosclerotic heart disease of native coronary artery without angina pectoris: Secondary | ICD-10-CM

## 2022-03-04 NOTE — Patient Instructions (Signed)
Medication Instructions:  Continue same medications *If you need a refill on your cardiac medications before your next appointment, please call your pharmacy*   Lab Work: None ordered   Testing/Procedures:  Echo   Follow-Up: At [facility], you and your health needs are our priority.  As part of our continuing mission to provide you with exceptional heart care, we have created designated Provider Care Teams.  These Care Teams include your primary Cardiologist (physician) and Advanced Practice Providers (APPs -  Physician Assistants and Nurse Practitioners) who all work together to provide you with the care you need, when you need it.  We recommend signing up for the patient portal called "MyChart".  Sign up information is provided on this After Visit Summary.  MyChart is used to connect with patients for Virtual Visits (Telemedicine).  Patients are able to view lab/test results, encounter notes, upcoming appointments, etc.  Non-urgent messages can be sent to your provider as well.   To learn more about what you can do with MyChart, go to [email or web address].      Your next appointment:  6 months   Call in Oct to schedule Feb appointment     The format for your next appointment:     Provider:  Dr.Schumann   EP  appointment scheduled with Dr.Taylor Wed 9/20 at 2:15 pm [facility] office  Important Information About Sugar

## 2022-03-06 DIAGNOSIS — H2513 Age-related nuclear cataract, bilateral: Secondary | ICD-10-CM | POA: Diagnosis not present

## 2022-03-11 DIAGNOSIS — G9689 Other specified disorders of central nervous system: Secondary | ICD-10-CM | POA: Diagnosis not present

## 2022-03-11 DIAGNOSIS — G3184 Mild cognitive impairment, so stated: Secondary | ICD-10-CM | POA: Diagnosis not present

## 2022-03-18 ENCOUNTER — Ambulatory Visit (HOSPITAL_COMMUNITY): Payer: Medicare Other | Attending: Cardiology

## 2022-03-18 DIAGNOSIS — I342 Nonrheumatic mitral (valve) stenosis: Secondary | ICD-10-CM | POA: Diagnosis not present

## 2022-03-18 DIAGNOSIS — R001 Bradycardia, unspecified: Secondary | ICD-10-CM | POA: Insufficient documentation

## 2022-03-18 LAB — ECHOCARDIOGRAM COMPLETE
AR max vel: 1.89 cm2
AV Area VTI: 1.77 cm2
AV Area mean vel: 1.69 cm2
AV Mean grad: 16 mmHg
AV Peak grad: 28.9 mmHg
Ao pk vel: 2.69 m/s
Area-P 1/2: 3.48 cm2
S' Lateral: 2.3 cm

## 2022-03-24 ENCOUNTER — Other Ambulatory Visit: Payer: Self-pay | Admitting: Cardiology

## 2022-04-02 ENCOUNTER — Ambulatory Visit (INDEPENDENT_AMBULATORY_CARE_PROVIDER_SITE_OTHER): Payer: Medicare Other

## 2022-04-02 ENCOUNTER — Encounter: Payer: Self-pay | Admitting: Internal Medicine

## 2022-04-02 ENCOUNTER — Ambulatory Visit: Payer: Medicare Other | Attending: Internal Medicine | Admitting: Internal Medicine

## 2022-04-02 VITALS — BP 104/60 | HR 50 | Ht 70.0 in | Wt 175.4 lb

## 2022-04-02 DIAGNOSIS — R001 Bradycardia, unspecified: Secondary | ICD-10-CM

## 2022-04-02 DIAGNOSIS — I10 Essential (primary) hypertension: Secondary | ICD-10-CM

## 2022-04-02 NOTE — Progress Notes (Unsigned)
Enrolled for [employer] to mail a ZIO XT long term holter monitor to the patients address on file.  

## 2022-04-02 NOTE — Progress Notes (Signed)
HPI [name] is referred by Albert Pierce for evaluation of sinus node dysfunction. 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 Cordelia Pierce office and noted to have a resting HR of 39/min. He denies syncope or limit to any activity and is on no AV nodal blocking or sinus nodal blocking drugs. He is active working in his yard. He has not had syncope. He has preserved LV function. He has very mild peripheral edema.  No Known Allergies   Current Outpatient Medications  Medication Sig Dispense Refill   amLODipine (NORVASC) 10 MG tablet Take 10 mg by mouth daily. Take 1 Tablets by mouth Daily     aspirin 81 MG tablet Take 81 mg by mouth daily.     Cyanocobalamin (VITAMIN B12) 1000 MCG TBCR Take 2,000 mcg by mouth daily.     donepezil (ARICEPT) 10 MG tablet Take one pill every morning with breakfast     fish oil-omega-3 fatty acids 1000 MG capsule Take 3,000 mg by mouth daily.     OVER THE COUNTER MEDICATION Take 50 mg by mouth daily. Cerebra 50 mg daily     rosuvastatin (CRESTOR) 20 MG tablet TAKE 1 TABLET EVERY DAY 90 tablet 3   Turmeric 500 MG TABS Take by mouth.     valsartan (DIOVAN) 320 MG tablet TAKE 1 TABLET EVERY DAY 90 tablet 3   VITAMIN E PO Take 180 mg by mouth daily.     No current facility-administered medications for this visit.     Past Medical History:  Diagnosis Date   Appendicitis    Arthritis    Gout    Hyperlipidemia    Hypertension    Short-term memory loss 2019   mild    ROS:   All systems reviewed and negative except as noted in the HPI.   Past Surgical History:  Procedure Laterality Date   APPENDECTOMY     76 y.o.   COLONOSCOPY     EYE MUSCLE SURGERY     UMBILICAL HERNIA REPAIR  2016   Dr Albert Pierce     Family History  Problem Relation Age of Onset   Parkinson's disease Mother    Tuberculosis Maternal Grandfather    Tuberculosis Paternal Grandfather    Parkinson's disease Maternal Aunt     Parkinson's disease Maternal Uncle    Dementia Neg Hx    Alzheimer's disease Neg Hx    Lung cancer Neg Hx      Social History   Socioeconomic History   Marital status: Married    Spouse name: Not on file   Number of children: 3   Years of education: 21   Highest education level: Professional school degree (e.g., MD, DDS, DVM, JD)  Occupational History   Not on file  Tobacco Use   Smoking status: Never   Smokeless tobacco: Never  Substance and Sexual Activity   Alcohol use: Yes    Alcohol/week: 2.0 - 8.0 standard drinks of alcohol    Types: 2 - 8 Standard drinks or equivalent per week    Comment: generally wine, occ beer or hard liquor    Drug use: No   Sexual activity: Not on file  Other Topics Concern   Not on file  Social History Narrative   Lives at home with wife   Right handed   Caffeine: diet coke, 2-4 cups/day   Social Determinants of Health   Financial Resource Strain: Not  on file  Food Insecurity: Not on file  Transportation Needs: Not on file  Physical Activity: Not on file  Stress: Not on file  Social Connections: Not on file  Intimate Partner Violence: Not on file     BP 104/60   Pulse (!) 50   Ht '5\' 10"'$  (1.778 m)   Wt 175 lb 6.4 oz (79.6 kg)   SpO2 95%   BMI 25.17 kg/m   Physical Exam:  Well appearing NAD HEENT: Unremarkable Neck:  No JVD, no thyromegally Lymphatics:  No adenopathy Back:  No CVA tenderness Lungs:  Clear with no wheezes HEART:  IRegular rate rhythm, no murmurs, no rubs, no clicks Abd:  soft, positive bowel sounds, no organomegally, no rebound, no guarding Ext:  2 plus pulses, no edema, no cyanosis, no clubbing Skin:  No rashes no nodules Neuro:  CN II through XII intact, motor grossly intact  EKG - sinus brady at 50/min  Assess/Plan:  Sinus node dysfunction - I cannot get him to tell me that he is symptomatic. I have asked him to obtain a 3 day Zio and we will reassess based on the results. No clear cut indication for  PPM at this time. Dementia - he is on aricept and I suspect that this is making his sinus node dysfunction worse.  CAD - he denies anginal symptoms and is still fairly active working in his yard.  Albert Pierce Albert Lowrey,MD

## 2022-04-02 NOTE — Patient Instructions (Addendum)
Medication Instructions:  Your physician recommends that you continue on your current medications as directed. Please refer to the Current Medication list given to you today.  *If you need a refill on your cardiac medications before your next appointment, please call your pharmacy*  Lab Work: None ordered.  If you have labs (blood work) drawn today and your tests are completely normal, you will receive your results only by: [place] (if you have MyChart) OR A paper copy in the mail If you have any lab test that is abnormal or we need to change your treatment, we will call you to review the results.  Testing/Procedures: 3 Day Zio Monitor ordered by Dr. Cristopher Peru.   Follow-Up: At [facility], you and your health needs are our priority.  As part of our continuing mission to provide you with exceptional heart care, we have created designated Provider Care Teams.  These Care Teams include your primary Cardiologist (physician) and Advanced Practice Providers (APPs -  Physician Assistants and Nurse Practitioners) who all work together to provide you with the care you need, when you need it.  We recommend signing up for the patient portal called "MyChart".  Sign up information is provided on this After Visit Summary.  MyChart is used to connect with patients for Virtual Visits (Telemedicine).  Patients are able to view lab/test results, encounter notes, upcoming appointments, etc.  Non-urgent messages can be sent to your provider as well.   To learn more about what you can do with MyChart, go to [email or web address].    Your next appointment:   Follow up based on 3 day Zio Monitor   The format for your next appointment:   In Person  Provider:   Cristopher Peru, MD{or one of the following Advanced Practice Providers on your designated Care Team:   Tommye Standard, [place] Legrand Como "Jonni Sanger" Tillery, PA-C   [place] Monitor Instructions  Your physician has requested you wear a  ZIO patch monitor for 14 days.  This is a single patch monitor. [employer] supplies one patch monitor per enrollment. Additional stickers are not available. Please do not apply patch if you will be having a Nuclear Stress Test,  Echocardiogram, Cardiac CT, MRI, or Chest Xray during the period you would be wearing the  monitor. The patch cannot be worn during these tests. You cannot remove and re-apply the  ZIO XT patch monitor.  Your ZIO patch monitor will be mailed 3 day USPS to your address on file. It may take 3-5 days  to receive your monitor after you have been enrolled.  Once you have received your monitor, please review the enclosed instructions. Your monitor  has already been registered assigning a specific monitor serial # to you.  Billing and Patient Assistance Program Information  We have supplied [employer] with any of your insurance information on file for billing purposes. [employer] offers a sliding scale Patient Assistance Program for patients that do not have  insurance, or whose insurance does not completely cover the cost of the ZIO monitor.  You must apply for the Patient Assistance Program to qualify for this discounted rate.  To apply, please call [employer] at [phone], select option 4, select option 2, ask to apply for  Patient Assistance Program. Theodore Demark will ask your household income, and how many people  are in your household. They will quote your out-of-pocket cost based on that information.  [employer] will also be able to set up a 93-month interest-free payment plan if  needed.  Applying the monitor   Shave hair from upper left chest.  Hold abrader disc by orange tab. Rub abrader in 40 strokes over the upper left chest as  indicated in your monitor instructions.  Clean area with 4 enclosed alcohol pads. Let dry.  Apply patch as indicated in monitor instructions. Patch will be placed under collarbone on left  side of chest with arrow pointing upward.  Rub patch  adhesive wings for 2 minutes. Remove white label marked "1". Remove the white  label marked "2". Rub patch adhesive wings for 2 additional minutes.  While looking in a mirror, press and release button in center of patch. A small green light will  flash 3-4 times. This will be your only indicator that the monitor has been turned on.  Do not shower for the first 24 hours. You may shower after the first 24 hours.  Press the button if you feel a symptom. You will hear a small click. Record Date, Time and  Symptom in the Patient Logbook.  When you are ready to remove the patch, follow instructions on the last 2 pages of Patient  Logbook. Stick patch monitor onto the last page of Patient Logbook.  Place Patient Logbook in the blue and white box. Use locking tab on box and tape box closed  securely. The blue and white box has prepaid postage on it. Please place it in the mailbox as  soon as possible. Your physician should have your test results approximately 7 days after the  monitor has been mailed back to [facility].  Call [place] at [phone] if you have questions regarding  your ZIO XT patch monitor. Call them immediately if you see an orange light blinking on your  monitor.  If your monitor falls off in less than 4 days, contact our Monitor department at [phone].  If your monitor becomes loose or falls off after 4 days call [employer] at [phone] for  suggestions on securing your monitor

## 2022-04-04 DIAGNOSIS — I10 Essential (primary) hypertension: Secondary | ICD-10-CM

## 2022-04-04 DIAGNOSIS — R001 Bradycardia, unspecified: Secondary | ICD-10-CM | POA: Diagnosis not present

## 2022-04-07 DIAGNOSIS — Z23 Encounter for immunization: Secondary | ICD-10-CM | POA: Diagnosis not present

## 2022-04-15 DIAGNOSIS — R001 Bradycardia, unspecified: Secondary | ICD-10-CM | POA: Diagnosis not present

## 2022-04-15 DIAGNOSIS — I10 Essential (primary) hypertension: Secondary | ICD-10-CM | POA: Diagnosis not present

## 2022-04-29 ENCOUNTER — Telehealth: Payer: Self-pay

## 2022-04-29 NOTE — Telephone Encounter (Signed)
-----   Message from Evans Lance, MD sent at 04/27/2022  9:03 AM EDT ----- Reassuring monitor with no significant arrhythmia.

## 2022-04-29 NOTE — Telephone Encounter (Signed)
Pt called and made aware of Dr. Tanna Furry report regarding his 3 day Zio monitor.  Pt understood.   Pt told that we will follow up with him regarding his next office visit, and consult Dr Lovena Le later this week.  Pt advised / understood.

## 2022-05-23 DIAGNOSIS — G3184 Mild cognitive impairment, so stated: Secondary | ICD-10-CM | POA: Diagnosis not present

## 2022-07-16 DIAGNOSIS — G3184 Mild cognitive impairment, so stated: Secondary | ICD-10-CM | POA: Diagnosis not present

## 2022-08-05 DIAGNOSIS — D2261 Melanocytic nevi of right upper limb, including shoulder: Secondary | ICD-10-CM | POA: Diagnosis not present

## 2022-08-05 DIAGNOSIS — D224 Melanocytic nevi of scalp and neck: Secondary | ICD-10-CM | POA: Diagnosis not present

## 2022-08-05 DIAGNOSIS — D2262 Melanocytic nevi of left upper limb, including shoulder: Secondary | ICD-10-CM | POA: Diagnosis not present

## 2022-08-05 DIAGNOSIS — C4441 Basal cell carcinoma of skin of scalp and neck: Secondary | ICD-10-CM | POA: Diagnosis not present

## 2022-08-05 DIAGNOSIS — L905 Scar conditions and fibrosis of skin: Secondary | ICD-10-CM | POA: Diagnosis not present

## 2022-08-05 DIAGNOSIS — C44519 Basal cell carcinoma of skin of other part of trunk: Secondary | ICD-10-CM | POA: Diagnosis not present

## 2022-08-05 DIAGNOSIS — Z85820 Personal history of malignant melanoma of skin: Secondary | ICD-10-CM | POA: Diagnosis not present

## 2022-08-05 DIAGNOSIS — D485 Neoplasm of uncertain behavior of skin: Secondary | ICD-10-CM | POA: Diagnosis not present

## 2022-08-05 DIAGNOSIS — Z85828 Personal history of other malignant neoplasm of skin: Secondary | ICD-10-CM | POA: Diagnosis not present

## 2022-08-05 DIAGNOSIS — D225 Melanocytic nevi of trunk: Secondary | ICD-10-CM | POA: Diagnosis not present

## 2022-08-05 DIAGNOSIS — L821 Other seborrheic keratosis: Secondary | ICD-10-CM | POA: Diagnosis not present

## 2022-08-06 DIAGNOSIS — F0280 Dementia in other diseases classified elsewhere without behavioral disturbance: Secondary | ICD-10-CM | POA: Diagnosis not present

## 2022-08-06 DIAGNOSIS — G309 Alzheimer's disease, unspecified: Secondary | ICD-10-CM | POA: Diagnosis not present

## 2022-08-13 DIAGNOSIS — M109 Gout, unspecified: Secondary | ICD-10-CM | POA: Diagnosis not present

## 2022-08-13 DIAGNOSIS — R7989 Other specified abnormal findings of blood chemistry: Secondary | ICD-10-CM | POA: Diagnosis not present

## 2022-08-13 DIAGNOSIS — I10 Essential (primary) hypertension: Secondary | ICD-10-CM | POA: Diagnosis not present

## 2022-08-13 DIAGNOSIS — R634 Abnormal weight loss: Secondary | ICD-10-CM | POA: Diagnosis not present

## 2022-08-13 DIAGNOSIS — E785 Hyperlipidemia, unspecified: Secondary | ICD-10-CM | POA: Diagnosis not present

## 2022-08-14 DIAGNOSIS — R82998 Other abnormal findings in urine: Secondary | ICD-10-CM | POA: Diagnosis not present

## 2022-08-14 DIAGNOSIS — I10 Essential (primary) hypertension: Secondary | ICD-10-CM | POA: Diagnosis not present

## 2022-08-15 DIAGNOSIS — F0280 Dementia in other diseases classified elsewhere without behavioral disturbance: Secondary | ICD-10-CM | POA: Diagnosis not present

## 2022-08-15 DIAGNOSIS — G309 Alzheimer's disease, unspecified: Secondary | ICD-10-CM | POA: Diagnosis not present

## 2022-08-15 DIAGNOSIS — G308 Other Alzheimer's disease: Secondary | ICD-10-CM | POA: Diagnosis not present

## 2022-08-20 DIAGNOSIS — I2510 Atherosclerotic heart disease of native coronary artery without angina pectoris: Secondary | ICD-10-CM | POA: Diagnosis not present

## 2022-08-20 DIAGNOSIS — R001 Bradycardia, unspecified: Secondary | ICD-10-CM | POA: Diagnosis not present

## 2022-08-20 DIAGNOSIS — E785 Hyperlipidemia, unspecified: Secondary | ICD-10-CM | POA: Diagnosis not present

## 2022-08-20 DIAGNOSIS — I129 Hypertensive chronic kidney disease with stage 1 through stage 4 chronic kidney disease, or unspecified chronic kidney disease: Secondary | ICD-10-CM | POA: Diagnosis not present

## 2022-08-20 DIAGNOSIS — I350 Nonrheumatic aortic (valve) stenosis: Secondary | ICD-10-CM | POA: Diagnosis not present

## 2022-08-20 DIAGNOSIS — J479 Bronchiectasis, uncomplicated: Secondary | ICD-10-CM | POA: Diagnosis not present

## 2022-08-20 DIAGNOSIS — M1990 Unspecified osteoarthritis, unspecified site: Secondary | ICD-10-CM | POA: Diagnosis not present

## 2022-08-20 DIAGNOSIS — N183 Chronic kidney disease, stage 3 unspecified: Secondary | ICD-10-CM | POA: Diagnosis not present

## 2022-08-20 DIAGNOSIS — G4733 Obstructive sleep apnea (adult) (pediatric): Secondary | ICD-10-CM | POA: Diagnosis not present

## 2022-08-20 DIAGNOSIS — Z0000 Encounter for general adult medical examination without abnormal findings: Secondary | ICD-10-CM | POA: Diagnosis not present

## 2022-08-20 DIAGNOSIS — R413 Other amnesia: Secondary | ICD-10-CM | POA: Diagnosis not present

## 2022-08-26 DIAGNOSIS — H2513 Age-related nuclear cataract, bilateral: Secondary | ICD-10-CM | POA: Diagnosis not present

## 2022-09-04 ENCOUNTER — Ambulatory Visit: Payer: Medicare Other | Admitting: Cardiology

## 2022-09-04 NOTE — Progress Notes (Signed)
Cardiology Office Note:    Date:  09/05/2022   ID:  Albert Pierce, DOB 03/14/1946, MRN [id number]  PCP:  Prince Solian, MD  Cardiologist:  None  Electrophysiologist:  None   Referring MD: Prince Solian, MD   No chief complaint on file.    History of Present Illness:    Albert Pierce is a 77 y.o. male with a hx of hypertension, mild aortic stenosis who presents for follow-up.  He was referred by Caprice Beaver, PA for evaluation of bradycardia, initially seen on 01/25/2020.  He was noted at clinic visit to have sinus bradycardia with rate 46.  He brought a log of his BP/pulse with him, and pulse has been as low as 39 at home.  Current medications include atenolol 25 mg daily, amlodipine 10 mg daily, and telmisartan 80 mg daily.  He denies any lightheadedness, syncope, fatigue, chest pain, dyspnea, or palpitations.  States that he exercises by walking his dog about 4 times per week for 20 to 30 minutes.  At initial clinic on 01/25/2020, and Tylenol was discontinued.  Echocardiogram on 02/13/2020 showed normal biventricular function, moderate focal basal septal LVH, grade 1 diastolic dysfunction, mild aortic stenosis, mild dilatation of the ascending aorta measuring 3 mm.  Zio patch x3 days on 02/28/2020 showed no significant arrhythmia, average heart rate 60 bpm.  Cardiac MRI on 04/04/2020 showed asymmetric hypertrophy measuring up to 16 mm and basal septum (8 mm and posterior wall), meeting criteria for HCM but patient's AS and hypertension could also be causes of asymmetric hypertrophy; no LGE, LVEF 72%, RVEF 63%, mild AI (regurgitant fraction 14%), ascending aortic dilatation measuring 40 mm.  Calcium score 1450 on 03/08/2021 (87th percentile); also noted to have bronchiectasis, referred to pulmonology.  MRA chest on 04/11/2021 showed ascending aortic dilatation measuring 40 mm.  Lexiscan Myoview on 07/25/2021 showed normal perfusion, EF 54%.  Echocardiogram 03/18/2022 showed EF 60 to 65%, grade 2  diastolic dysfunction, normal RV function, moderate left atrial enlargement, mild mitral regurgitation, mild aortic stenosis/aortic regurgitation, mild dilatation of ascending aorta measuring 41 mm.  Zio patch x 3 days on 04/2022 showed no significant arrhythmias.  Since last clinic visit, reports he is doing well  Denies any chest pain, dyspnea, lightheadedness, syncope, lower extremity edema, or palpitations.  Walks dog daily and sees personal trainer twice per week for 30 minutes.  Denies any exertional symptoms.    Past Medical History:  Diagnosis Date   Appendicitis    Arthritis    Gout    Hyperlipidemia    Hypertension    Short-term memory loss 2019   mild    Past Surgical History:  Procedure Laterality Date   APPENDECTOMY     77 y.o.   COLONOSCOPY     EYE MUSCLE SURGERY     UMBILICAL HERNIA REPAIR  2016   Dr Dalbert Batman    Current Medications: Current Meds  Medication Sig   amLODipine (NORVASC) 10 MG tablet Take 10 mg by mouth daily. Take 1 Tablets by mouth Daily   aspirin 81 MG tablet Take 81 mg by mouth daily.   Cyanocobalamin (VITAMIN B12) 1000 MCG TBCR Take 2,000 mcg by mouth daily.   donepezil (ARICEPT) 10 MG tablet Take one pill every morning with breakfast   fish oil-omega-3 fatty acids 1000 MG capsule Take 3,000 mg by mouth daily.   rosuvastatin (CRESTOR) 20 MG tablet TAKE 1 TABLET EVERY DAY   Turmeric 500 MG TABS Take by mouth.   valsartan (  DIOVAN) 320 MG tablet TAKE 1 TABLET EVERY DAY     Allergies:   Patient has no known allergies.   Social History   Socioeconomic History   Marital status: Married    Spouse name: Not on file   Number of children: 3   Years of education: 10   Highest education level: Professional school degree (e.g., MD, DDS, DVM, JD)  Occupational History   Not on file  Tobacco Use   Smoking status: Never   Smokeless tobacco: Never  Substance and Sexual Activity   Alcohol use: Yes    Alcohol/week: 2.0 - 8.0 standard drinks of  alcohol    Types: 2 - 8 Standard drinks or equivalent per week    Comment: generally wine, occ beer or hard liquor    Drug use: No   Sexual activity: Not on file  Other Topics Concern   Not on file  Social History Narrative   Lives at home with wife   Right handed   Caffeine: diet coke, 2-4 cups/day   Social Determinants of Health   Financial Resource Strain: Not on file  Food Insecurity: Not on file  Transportation Needs: Not on file  Physical Activity: Not on file  Stress: Not on file  Social Connections: Not on file     Family History: The patient's family history includes Parkinson's disease in his maternal aunt, maternal uncle, and mother; Tuberculosis in his maternal grandfather and paternal grandfather. There is no history of Dementia, Alzheimer's disease, or Lung cancer.  ROS:   Please see the history of present illness.    All other systems reviewed and are negative.  EKGs/Labs/Other Studies Reviewed:    The following studies were reviewed today:  EKG:  03/04/22: Sinus bradycardia, rate 39, left axis deviation 06/10/21:sinus bradycardia, rate 54, LAD 12/12/2020- The EKG ordered demonstrates sinus bradycardia, rate 51, Q waves in lead 1 and 2  04/19/2020- The EKG ordered demonstrates sinus rhythm, rate 51, left axis deviation, nonspecific T wave flattening  Recent Labs: No results found for requested labs within last 365 days.  Recent Lipid Panel    Component Value Date/Time   CHOL 135 06/10/2021 0944   TRIG 81 06/10/2021 0944   TRIG 264 (HH) 05/27/2006 0848   HDL 50 06/10/2021 0944   CHOLHDL 2.7 06/10/2021 0944   CHOLHDL 5 02/14/2014 0923   VLDL 26.4 02/14/2014 0923   LDLCALC 69 06/10/2021 0944   LDLDIRECT 79.1 04/17/2009 0819    Physical Exam:    VS:  BP 133/71   Pulse 72   Ht '5\' 10"'$  (1.778 m)   Wt 179 lb 9.6 oz (81.5 kg)   SpO2 98%   BMI 25.77 kg/m     Wt Readings from Last 3 Encounters:  09/05/22 179 lb 9.6 oz (81.5 kg)  04/02/22 175 lb 6.4  oz (79.6 kg)  03/04/22 175 lb 9.6 oz (79.7 kg)     GEN:  in no acute distress  HEENT: Normal NECK: No JVD; No carotid bruits LYMPHATICS: No lymphadenopathy CARDIAC: regular, bradycardic, 2/6 systolic murmur RESPIRATORY:  Clear to auscultation without rales, wheezing or rhonchi  ABDOMEN: Soft, non-tender, non-distended MUSCULOSKELETAL:  No edema; No deformity  SKIN: Warm and dry NEUROLOGIC:  Alert and oriented x 3 PSYCHIATRIC:  Normal affect   ASSESSMENT:    1. Sinus bradycardia   2. Coronary artery disease involving native coronary artery of native heart without angina pectoris   3. LVH (left ventricular hypertrophy)   4. Essential hypertension  5. Aortic valve stenosis, etiology of cardiac valve disease unspecified   6. Aortic dilatation (HCC)   7. Hyperlipidemia, unspecified hyperlipidemia type       PLAN:    Sinus bradycardia: Heart rate as low as high 30s at home at initial clinic visit in 01/2020.   Discontinued atenolol. Zio patch 02/28/2020 after discontinuing atenolol shows no significant bradyarrhythmia, average heart rate 60 bpm.  Had marked sinus bradycardia to 30s at clinic visit 02/2022.  Referred to EP, saw Dr. Lovena Le and checked his Zio patch x 3 days.  No significant arrhythmias seen  CAD: Calcium score 1450 on 03/08/2021 (87th percentile).  Denies any chest pain.  Does report mild dyspnea.  Lexiscan Myoview on 07/25/2021 showed normal perfusion, EF 54%. -Continue aspirin 81 mg daily -Continue rosuvastatin 20 mg daily  LVH: Echocardiogram on 02/13/2020 showed moderate focal basal septal LVH.  Cardiac MRI on 04/04/2020 showed asymmetric hypertrophy measuring up to 16 mm and basal septum (8 mm and posterior wall), meeting criteria for HCM.  However the patient's AS and hypertension could also be causes of asymmetric hypertrophy.  No LGE on MRI.  Hypertension: On amlodipine 10 mg daily and valsartan 320 mg daily.  Appears controlled.  Aortic stenosis: Echocardiogram on  02/13/2020 showed mild AS, mild AI. Mild AI on CMR 04/04/2020.  Echocardiogram 03/2022 showed mild AI/mild AS  Aortic dilatation: Ascending aorta measured 41 mm on echo on 05/2018.  MRA on 04/04/2020 showed ascending aorta measured 40 mm.  MRA chest on 04/11/2021 showed ascending aortic dilatation measuring 40 mm.  Ascending aorta measured 41 mm on echo 03/2022  Hyperlipidemia: On rosuvastatin 10 mg daily,  LDL 89 07/16/20.  Calcium score 1450 02/2021 on 03/08/2021 (87th percentile), rosuvastatin increased to 20 mg daily.  LDL 72 on 08/13/22  Bronchiectasis: Noted on calcium score 02/2021, referred to pulmonology.  OSA: Reports compliance with CPAP  RTC in 6 months     Medication Adjustments/Labs and Tests Ordered: Current medicines are reviewed at length with the patient today.  Concerns regarding medicines are outlined above.  No orders of the defined types were placed in this encounter.   No orders of the defined types were placed in this encounter.    Patient Instructions  Medication Instructions:  Your physician recommends that you continue on your current medications as directed. Please refer to the Current Medication list given to you today.  *If you need a refill on your cardiac medications before your next appointment, please call your pharmacy*  Follow-Up: At [facility], you and your health needs are our priority.  As part of our continuing mission to provide you with exceptional heart care, we have created designated Provider Care Teams.  These Care Teams include your primary Cardiologist (physician) and Advanced Practice Providers (APPs -  Physician Assistants and Nurse Practitioners) who all work together to provide you with the care you need, when you need it.  We recommend signing up for the patient portal called "MyChart".  Sign up information is provided on this After Visit Summary.  MyChart is used to connect with patients for Virtual Visits (Telemedicine).  Patients  are able to view lab/test results, encounter notes, upcoming appointments, etc.  Non-urgent messages can be sent to your provider as well.   To learn more about what you can do with MyChart, go to [email or web address].    Your next appointment:   6 month(s)  Provider:   Dr. Gardiner Rhyme      Signed, Donato Heinz,  MD  09/05/2022 10:22 AM    [facility]

## 2022-09-05 ENCOUNTER — Encounter: Payer: Self-pay | Admitting: Cardiology

## 2022-09-05 ENCOUNTER — Ambulatory Visit: Payer: Medicare Other | Attending: Cardiology | Admitting: Cardiology

## 2022-09-05 VITALS — BP 133/71 | HR 72 | Ht 70.0 in | Wt 179.6 lb

## 2022-09-05 DIAGNOSIS — I77819 Aortic ectasia, unspecified site: Secondary | ICD-10-CM

## 2022-09-05 DIAGNOSIS — E785 Hyperlipidemia, unspecified: Secondary | ICD-10-CM | POA: Diagnosis not present

## 2022-09-05 DIAGNOSIS — I10 Essential (primary) hypertension: Secondary | ICD-10-CM

## 2022-09-05 DIAGNOSIS — I2510 Atherosclerotic heart disease of native coronary artery without angina pectoris: Secondary | ICD-10-CM | POA: Diagnosis not present

## 2022-09-05 DIAGNOSIS — I350 Nonrheumatic aortic (valve) stenosis: Secondary | ICD-10-CM

## 2022-09-05 DIAGNOSIS — R001 Bradycardia, unspecified: Secondary | ICD-10-CM

## 2022-09-05 DIAGNOSIS — I517 Cardiomegaly: Secondary | ICD-10-CM | POA: Diagnosis not present

## 2022-09-05 NOTE — Patient Instructions (Signed)
Medication Instructions:  Your physician recommends that you continue on your current medications as directed. Please refer to the Current Medication list given to you today.  *If you need a refill on your cardiac medications before your next appointment, please call your pharmacy*  Follow-Up: At [facility], you and your health needs are our priority.  As part of our continuing mission to provide you with exceptional heart care, we have created designated Provider Care Teams.  These Care Teams include your primary Cardiologist (physician) and Advanced Practice Providers (APPs -  Physician Assistants and Nurse Practitioners) who all work together to provide you with the care you need, when you need it.  We recommend signing up for the patient portal called "MyChart".  Sign up information is provided on this After Visit Summary.  MyChart is used to connect with patients for Virtual Visits (Telemedicine).  Patients are able to view lab/test results, encounter notes, upcoming appointments, etc.  Non-urgent messages can be sent to your provider as well.   To learn more about what you can do with MyChart, go to [email or web address].    Your next appointment:   6 month(s)  Provider:   Dr. Gardiner Rhyme

## 2022-09-15 DIAGNOSIS — Z85820 Personal history of malignant melanoma of skin: Secondary | ICD-10-CM | POA: Diagnosis not present

## 2022-09-15 DIAGNOSIS — Z85828 Personal history of other malignant neoplasm of skin: Secondary | ICD-10-CM | POA: Diagnosis not present

## 2022-09-15 DIAGNOSIS — C44319 Basal cell carcinoma of skin of other parts of face: Secondary | ICD-10-CM | POA: Diagnosis not present

## 2022-10-06 ENCOUNTER — Telehealth: Payer: Self-pay | Admitting: Cardiology

## 2022-10-06 NOTE — Telephone Encounter (Signed)
Paper Work Dropped Off: Blood Pressure Report  Date: 10/06/2022  Location of paper:  Provider mailbox

## 2022-10-21 NOTE — Telephone Encounter (Signed)
Paper Work Dropped Off: Blood Pressure Report   Date: 10/21/2022  Location of paper:  Provider Mailbox

## 2022-11-04 ENCOUNTER — Telehealth: Payer: Self-pay | Admitting: Cardiology

## 2022-11-04 NOTE — Telephone Encounter (Signed)
Paper Work Dropped Off: Blood Pressure Logs  Date: 11/04/22  Location of paper:  Provider Mailbox

## 2022-11-06 DIAGNOSIS — G309 Alzheimer's disease, unspecified: Secondary | ICD-10-CM | POA: Diagnosis not present

## 2022-11-06 DIAGNOSIS — Z006 Encounter for examination for normal comparison and control in clinical research program: Secondary | ICD-10-CM | POA: Diagnosis not present

## 2022-11-06 DIAGNOSIS — F0280 Dementia in other diseases classified elsewhere without behavioral disturbance: Secondary | ICD-10-CM | POA: Diagnosis not present

## 2022-11-10 ENCOUNTER — Telehealth: Payer: Self-pay | Admitting: Cardiology

## 2022-11-10 NOTE — Telephone Encounter (Signed)
Paper Work Dropped Off: Blood Pressure Report  Date: 11/10/2022  Location of paper:  Provider Mailbox

## 2022-11-20 DIAGNOSIS — F0670 Mild neurocognitive disorder due to known physiological condition without behavioral disturbance: Secondary | ICD-10-CM | POA: Diagnosis not present

## 2022-11-20 DIAGNOSIS — G309 Alzheimer's disease, unspecified: Secondary | ICD-10-CM | POA: Diagnosis not present

## 2022-11-20 DIAGNOSIS — F0280 Dementia in other diseases classified elsewhere without behavioral disturbance: Secondary | ICD-10-CM | POA: Diagnosis not present

## 2022-11-20 DIAGNOSIS — Z006 Encounter for examination for normal comparison and control in clinical research program: Secondary | ICD-10-CM | POA: Diagnosis not present

## 2022-11-24 ENCOUNTER — Telehealth: Payer: Self-pay | Admitting: Cardiology

## 2022-11-24 NOTE — Telephone Encounter (Signed)
Paper Work Dropped Off: Blood Pressure report  Date: 05.13.24  Location of paper: Put in Dr [employer]	[place]

## 2022-12-04 DIAGNOSIS — Z006 Encounter for examination for normal comparison and control in clinical research program: Secondary | ICD-10-CM | POA: Diagnosis not present

## 2022-12-04 DIAGNOSIS — G309 Alzheimer's disease, unspecified: Secondary | ICD-10-CM | POA: Diagnosis not present

## 2022-12-04 DIAGNOSIS — F0280 Dementia in other diseases classified elsewhere without behavioral disturbance: Secondary | ICD-10-CM | POA: Diagnosis not present

## 2022-12-10 ENCOUNTER — Encounter: Payer: Self-pay | Admitting: Gastroenterology

## 2022-12-15 ENCOUNTER — Telehealth: Payer: Self-pay | Admitting: Cardiology

## 2022-12-15 NOTE — Telephone Encounter (Signed)
Paper Work Dropped Off: Blood pressure log  Date: 06.23.24   Location of paper: Dr [employer] mailbox

## 2022-12-18 DIAGNOSIS — F0670 Mild neurocognitive disorder due to known physiological condition without behavioral disturbance: Secondary | ICD-10-CM | POA: Diagnosis not present

## 2022-12-18 DIAGNOSIS — Z006 Encounter for examination for normal comparison and control in clinical research program: Secondary | ICD-10-CM | POA: Diagnosis not present

## 2022-12-18 DIAGNOSIS — G309 Alzheimer's disease, unspecified: Secondary | ICD-10-CM | POA: Diagnosis not present

## 2022-12-22 ENCOUNTER — Telehealth: Payer: Self-pay | Admitting: Cardiology

## 2022-12-22 NOTE — Telephone Encounter (Signed)
Paper Work Dropped Off: BP report   Date: 06.10.24 @ 11.18 am  Location of paper: Dr [employer]	[place]

## 2022-12-23 DIAGNOSIS — G309 Alzheimer's disease, unspecified: Secondary | ICD-10-CM | POA: Diagnosis not present

## 2022-12-23 DIAGNOSIS — F0280 Dementia in other diseases classified elsewhere without behavioral disturbance: Secondary | ICD-10-CM | POA: Diagnosis not present

## 2022-12-23 DIAGNOSIS — F0670 Mild neurocognitive disorder due to known physiological condition without behavioral disturbance: Secondary | ICD-10-CM | POA: Diagnosis not present

## 2023-01-08 DIAGNOSIS — Z006 Encounter for examination for normal comparison and control in clinical research program: Secondary | ICD-10-CM | POA: Diagnosis not present

## 2023-01-08 DIAGNOSIS — F0280 Dementia in other diseases classified elsewhere without behavioral disturbance: Secondary | ICD-10-CM | POA: Diagnosis not present

## 2023-01-08 DIAGNOSIS — G309 Alzheimer's disease, unspecified: Secondary | ICD-10-CM | POA: Diagnosis not present

## 2023-01-12 ENCOUNTER — Telehealth: Payer: Self-pay | Admitting: Cardiology

## 2023-01-12 NOTE — Telephone Encounter (Signed)
Paper Work Dropped Off: bp log  Date:7.1.24  Location of paper:  MD BOX

## 2023-01-29 DIAGNOSIS — Z006 Encounter for examination for normal comparison and control in clinical research program: Secondary | ICD-10-CM | POA: Diagnosis not present

## 2023-01-29 DIAGNOSIS — G309 Alzheimer's disease, unspecified: Secondary | ICD-10-CM | POA: Diagnosis not present

## 2023-01-29 DIAGNOSIS — F0280 Dementia in other diseases classified elsewhere without behavioral disturbance: Secondary | ICD-10-CM | POA: Diagnosis not present

## 2023-01-31 DIAGNOSIS — F0280 Dementia in other diseases classified elsewhere without behavioral disturbance: Secondary | ICD-10-CM | POA: Diagnosis not present

## 2023-01-31 DIAGNOSIS — R413 Other amnesia: Secondary | ICD-10-CM | POA: Diagnosis not present

## 2023-01-31 DIAGNOSIS — G309 Alzheimer's disease, unspecified: Secondary | ICD-10-CM | POA: Diagnosis not present

## 2023-02-02 ENCOUNTER — Telehealth: Payer: Self-pay | Admitting: Cardiology

## 2023-02-02 NOTE — Telephone Encounter (Signed)
Paper Work Dropped Off: BP report  Date: 07.22.24 @ 2:55pm  Location of paper: Dr [employer]	[place]

## 2023-02-05 NOTE — Telephone Encounter (Signed)
B/P readings placed in Dr.Schumann's folder for review.

## 2023-02-16 ENCOUNTER — Telehealth: Payer: Self-pay | Admitting: Cardiology

## 2023-02-16 DIAGNOSIS — Z85828 Personal history of other malignant neoplasm of skin: Secondary | ICD-10-CM | POA: Diagnosis not present

## 2023-02-16 DIAGNOSIS — L570 Actinic keratosis: Secondary | ICD-10-CM | POA: Diagnosis not present

## 2023-02-16 DIAGNOSIS — D225 Melanocytic nevi of trunk: Secondary | ICD-10-CM | POA: Diagnosis not present

## 2023-02-16 DIAGNOSIS — Z85820 Personal history of malignant melanoma of skin: Secondary | ICD-10-CM | POA: Diagnosis not present

## 2023-02-16 DIAGNOSIS — D2262 Melanocytic nevi of left upper limb, including shoulder: Secondary | ICD-10-CM | POA: Diagnosis not present

## 2023-02-16 DIAGNOSIS — L905 Scar conditions and fibrosis of skin: Secondary | ICD-10-CM | POA: Diagnosis not present

## 2023-02-16 DIAGNOSIS — D2261 Melanocytic nevi of right upper limb, including shoulder: Secondary | ICD-10-CM | POA: Diagnosis not present

## 2023-02-16 DIAGNOSIS — L821 Other seborrheic keratosis: Secondary | ICD-10-CM | POA: Diagnosis not present

## 2023-02-16 NOTE — Telephone Encounter (Signed)
Paper Work Dropped Off: Blood pressure report   Date:02/16/2023  Location of paper:Dr mail box

## 2023-02-17 DIAGNOSIS — M1990 Unspecified osteoarthritis, unspecified site: Secondary | ICD-10-CM | POA: Diagnosis not present

## 2023-02-17 DIAGNOSIS — I2510 Atherosclerotic heart disease of native coronary artery without angina pectoris: Secondary | ICD-10-CM | POA: Diagnosis not present

## 2023-02-17 DIAGNOSIS — G4733 Obstructive sleep apnea (adult) (pediatric): Secondary | ICD-10-CM | POA: Diagnosis not present

## 2023-02-17 DIAGNOSIS — I350 Nonrheumatic aortic (valve) stenosis: Secondary | ICD-10-CM | POA: Diagnosis not present

## 2023-02-17 DIAGNOSIS — M109 Gout, unspecified: Secondary | ICD-10-CM | POA: Diagnosis not present

## 2023-02-17 DIAGNOSIS — I129 Hypertensive chronic kidney disease with stage 1 through stage 4 chronic kidney disease, or unspecified chronic kidney disease: Secondary | ICD-10-CM | POA: Diagnosis not present

## 2023-02-17 DIAGNOSIS — R413 Other amnesia: Secondary | ICD-10-CM | POA: Diagnosis not present

## 2023-02-17 DIAGNOSIS — J479 Bronchiectasis, uncomplicated: Secondary | ICD-10-CM | POA: Diagnosis not present

## 2023-02-17 DIAGNOSIS — R001 Bradycardia, unspecified: Secondary | ICD-10-CM | POA: Diagnosis not present

## 2023-02-17 DIAGNOSIS — N183 Chronic kidney disease, stage 3 unspecified: Secondary | ICD-10-CM | POA: Diagnosis not present

## 2023-02-17 DIAGNOSIS — H02409 Unspecified ptosis of unspecified eyelid: Secondary | ICD-10-CM | POA: Diagnosis not present

## 2023-02-17 DIAGNOSIS — E785 Hyperlipidemia, unspecified: Secondary | ICD-10-CM | POA: Diagnosis not present

## 2023-02-19 DIAGNOSIS — Z006 Encounter for examination for normal comparison and control in clinical research program: Secondary | ICD-10-CM | POA: Diagnosis not present

## 2023-02-19 DIAGNOSIS — F0280 Dementia in other diseases classified elsewhere without behavioral disturbance: Secondary | ICD-10-CM | POA: Diagnosis not present

## 2023-02-19 DIAGNOSIS — G309 Alzheimer's disease, unspecified: Secondary | ICD-10-CM | POA: Diagnosis not present

## 2023-02-20 DIAGNOSIS — H2513 Age-related nuclear cataract, bilateral: Secondary | ICD-10-CM | POA: Diagnosis not present

## 2023-02-23 ENCOUNTER — Telehealth: Payer: Self-pay | Admitting: Cardiology

## 2023-02-23 NOTE — Telephone Encounter (Signed)
Paper Work Dropped Off: Blood Pressure Report  Date: 02/23/2023  Location of paper: Provider Mailbox

## 2023-03-03 ENCOUNTER — Telehealth: Payer: Self-pay | Admitting: Diagnostic Radiology

## 2023-03-03 DIAGNOSIS — F0280 Dementia in other diseases classified elsewhere without behavioral disturbance: Secondary | ICD-10-CM | POA: Diagnosis not present

## 2023-03-03 DIAGNOSIS — F02A0 Dementia in other diseases classified elsewhere, mild, without behavioral disturbance, psychotic disturbance, mood disturbance, and anxiety: Secondary | ICD-10-CM | POA: Diagnosis not present

## 2023-03-03 DIAGNOSIS — G309 Alzheimer's disease, unspecified: Secondary | ICD-10-CM | POA: Diagnosis not present

## 2023-03-03 NOTE — Telephone Encounter (Signed)
Paper Work Dropped Off: Blood Pressure Log  Date: 03/03/2023  Location of paper: Provider Mailbox

## 2023-03-05 DIAGNOSIS — F0280 Dementia in other diseases classified elsewhere without behavioral disturbance: Secondary | ICD-10-CM | POA: Diagnosis not present

## 2023-03-05 DIAGNOSIS — G309 Alzheimer's disease, unspecified: Secondary | ICD-10-CM | POA: Diagnosis not present

## 2023-03-05 DIAGNOSIS — Z006 Encounter for examination for normal comparison and control in clinical research program: Secondary | ICD-10-CM | POA: Diagnosis not present

## 2023-03-05 NOTE — Progress Notes (Signed)
Cardiology Office Note:    Date:  03/06/2023   ID:  Albert Pierce, DOB Feb 08, 1946, MRN [id number]  PCP:  Chilton Greathouse, MD  Cardiologist:  Little Ishikawa, MD  Electrophysiologist:  None   Referring MD: Chilton Greathouse, MD   No chief complaint on file.    History of Present Illness:    Albert Pierce is a 77 y.o. male with a hx of hypertension, mild aortic stenosis who presents for follow-up.  He was referred by Jarome Lamas, PA for evaluation of bradycardia, initially seen on 01/25/2020.  He was noted at clinic visit to have sinus bradycardia with rate 46.  He brought a log of his BP/pulse with him, and pulse has been as low as 39 at home.  Current medications include atenolol 25 mg daily, amlodipine 10 mg daily, and telmisartan 80 mg daily.  He denies any lightheadedness, syncope, fatigue, chest pain, dyspnea, or palpitations.  States that he exercises by walking his dog about 4 times per week for 20 to 30 minutes.  At initial clinic on 01/25/2020, and Tylenol was discontinued.  Echocardiogram on 02/13/2020 showed normal biventricular function, moderate focal basal septal LVH, grade 1 diastolic dysfunction, mild aortic stenosis, mild dilatation of the ascending aorta measuring 3 mm.  Zio patch x3 days on 02/28/2020 showed no significant arrhythmia, average heart rate 60 bpm.  Cardiac MRI on 04/04/2020 showed asymmetric hypertrophy measuring up to 16 mm and basal septum (8 mm and posterior wall), meeting criteria for HCM but patient's AS and hypertension could also be causes of asymmetric hypertrophy; no LGE, LVEF 72%, RVEF 63%, mild AI (regurgitant fraction 14%), ascending aortic dilatation measuring 40 mm.  Calcium score 1450 on 03/08/2021 (87th percentile); also noted to have bronchiectasis, referred to pulmonology.  MRA chest on 04/11/2021 showed ascending aortic dilatation measuring 40 mm.  Lexiscan Myoview on 07/25/2021 showed normal perfusion, EF 54%.  Echocardiogram 03/18/2022 showed  EF 60 to 65%, grade 2 diastolic dysfunction, normal RV function, moderate left atrial enlargement, mild mitral regurgitation, mild aortic stenosis/aortic regurgitation, mild dilatation of ascending aorta measuring 41 mm.  Zio patch x 3 days on 04/2022 showed no significant arrhythmias.  Since last clinic visit, he reports he is doing well.  Denies any chest pain, dyspnea, lightheadedness, syncope, lower extremity edema, or palpitations.  Did have an episode during infusion for lecanemab where they did have to stop the infusion because his heart rate was in the 30s and he was feeling lightheaded.   Past Medical History:  Diagnosis Date   Appendicitis    Arthritis    Gout    Hyperlipidemia    Hypertension    Short-term memory loss 2019   mild    Past Surgical History:  Procedure Laterality Date   APPENDECTOMY     77 y.o.   COLONOSCOPY     EYE MUSCLE SURGERY     UMBILICAL HERNIA REPAIR  2016   Dr Derrell Lolling    Current Medications: Current Meds  Medication Sig   amLODipine (NORVASC) 10 MG tablet Take 10 mg by mouth daily. Take 1 Tablets by mouth Daily   aspirin 81 MG tablet Take 81 mg by mouth daily.   Cyanocobalamin (VITAMIN B12) 1000 MCG TBCR Take 2,000 mcg by mouth daily.   donepezil (ARICEPT) 5 MG tablet Take 5 mg by mouth at bedtime.   fish oil-omega-3 fatty acids 1000 MG capsule Take 3,000 mg by mouth daily.   rosuvastatin (CRESTOR) 20 MG tablet TAKE 1 TABLET  EVERY DAY   Turmeric 500 MG TABS Take by mouth.   valsartan (DIOVAN) 320 MG tablet TAKE 1 TABLET EVERY DAY     Allergies:   Patient has no known allergies.   Social History   Socioeconomic History   Marital status: Married    Spouse name: Not on file   Number of children: 3   Years of education: 67   Highest education level: Professional school degree (e.g., MD, DDS, DVM, JD)  Occupational History   Not on file  Tobacco Use   Smoking status: Never   Smokeless tobacco: Never  Substance and Sexual Activity    Alcohol use: Yes    Alcohol/week: 2.0 - 8.0 standard drinks of alcohol    Types: 2 - 8 Standard drinks or equivalent per week    Comment: generally wine, occ beer or hard liquor    Drug use: No   Sexual activity: Not on file  Other Topics Concern   Not on file  Social History Narrative   Lives at home with wife   Right handed   Caffeine: diet coke, 2-4 cups/day   Social Determinants of Health   Financial Resource Strain: Not on file  Food Insecurity: Not on file  Transportation Needs: Not on file  Physical Activity: Not on file  Stress: Not on file  Social Connections: Not on file     Family History: The patient's family history includes Parkinson's disease in his maternal aunt, maternal uncle, and mother; Tuberculosis in his maternal grandfather and paternal grandfather. There is no history of Dementia, Alzheimer's disease, or Lung cancer.  ROS:   Please see the history of present illness.    All other systems reviewed and are negative.  EKGs/Labs/Other Studies Reviewed:    The following studies were reviewed today:  EKG:  03/06/2023: Sinus bradycardia, rate 46, left axis deviation 03/04/22: Sinus bradycardia, rate 39, left axis deviation 06/10/21:sinus bradycardia, rate 54, LAD 12/12/2020- The EKG ordered demonstrates sinus bradycardia, rate 51, Q waves in lead 1 and 2  04/19/2020- The EKG ordered demonstrates sinus rhythm, rate 51, left axis deviation, nonspecific T wave flattening  Recent Labs: No results found for requested labs within last 365 days.  Recent Lipid Panel    Component Value Date/Time   CHOL 135 06/10/2021 0944   TRIG 81 06/10/2021 0944   TRIG 264 (HH) 05/27/2006 0848   HDL 50 06/10/2021 0944   CHOLHDL 2.7 06/10/2021 0944   CHOLHDL 5 02/14/2014 0923   VLDL 26.4 02/14/2014 0923   LDLCALC 69 06/10/2021 0944   LDLDIRECT 79.1 04/17/2009 0819    Physical Exam:    VS:  BP 116/60   Pulse (!) 46   Ht 5\' 10"  (1.778 m)   Wt 174 lb 9.6 oz (79.2 kg)    SpO2 98%   BMI 25.05 kg/m     Wt Readings from Last 3 Encounters:  03/06/23 174 lb 9.6 oz (79.2 kg)  09/05/22 179 lb 9.6 oz (81.5 kg)  04/02/22 175 lb 6.4 oz (79.6 kg)     GEN:  in no acute distress  HEENT: Normal NECK: No JVD; No carotid bruits LYMPHATICS: No lymphadenopathy CARDIAC: regular, bradycardic, 2/6 systolic murmur RESPIRATORY:  Clear to auscultation without rales, wheezing or rhonchi  ABDOMEN: Soft, non-tender, non-distended MUSCULOSKELETAL:  No edema; No deformity  SKIN: Warm and dry NEUROLOGIC:  Alert and oriented x 3 PSYCHIATRIC:  Normal affect   ASSESSMENT:    1. Sinus bradycardia   2. Aortic root  dilatation (HCC)   3. Nonrheumatic aortic valve stenosis   4. Coronary artery disease involving native coronary artery of native heart without angina pectoris   5. Essential hypertension      PLAN:    Sinus bradycardia: Heart rate as low as high 30s at home at initial clinic visit in 01/2020.   Discontinued atenolol. Zio patch 02/28/2020 after discontinuing atenolol shows no significant bradyarrhythmia, average heart rate 60 bpm.  Had marked sinus bradycardia to 30s at clinic visit 02/2022.  Referred to EP, saw Dr. Ladona Ridgel and checked his Zio patch x 3 days.  No significant arrhythmias seen. -Did have an episode during infusion for lecanemab where they did have to stop the infusion because his heart rate was in the 30s and he was feeling lightheaded.  Decreased aricept this week, may help with heart rate.   Will discuss with Dr Ladona Ridgel, suspect heading for PPM  CAD: Calcium score 1450 on 03/08/2021 (87th percentile).  Denies any chest pain.  Does report mild dyspnea.  Lexiscan Myoview on 07/25/2021 showed normal perfusion, EF 54%. -Continue aspirin 81 mg daily -Continue rosuvastatin 20 mg daily  LVH: Echocardiogram on 02/13/2020 showed moderate focal basal septal LVH.  Cardiac MRI on 04/04/2020 showed asymmetric hypertrophy measuring up to 16 mm and basal septum (8 mm and  posterior wall), meeting criteria for HCM.  However the patient's AS and hypertension could also be causes of asymmetric hypertrophy.  No LGE on MRI.  Hypertension: On amlodipine 10 mg daily and valsartan 320 mg daily.  Appears controlled.  Aortic stenosis: Echocardiogram on 02/13/2020 showed mild AS, mild AI. Mild AI on CMR 04/04/2020.  Echocardiogram 03/2022 showed mild AI/mild AS.  Will monitor  Aortic dilatation: Ascending aorta measured 41 mm on echo on 05/2018.  MRA on 04/04/2020 showed ascending aorta measured 40 mm.  MRA chest on 04/11/2021 showed ascending aortic dilatation measuring 40 mm.  Ascending aorta measured 41 mm on echo 03/2022.  Will repeat echo to monitor prior to next clinic visit  Hyperlipidemia: On rosuvastatin 10 mg daily,  LDL 89 07/16/20.  Calcium score 1450 02/2021 on 03/08/2021 (87th percentile), rosuvastatin increased to 20 mg daily.  LDL 72 on 08/13/22  Bronchiectasis: Noted on calcium score 02/2021, referred to pulmonology.  OSA: Reports compliance with CPAP  RTC in 6 months   Medication Adjustments/Labs and Tests Ordered: Current medicines are reviewed at length with the patient today.  Concerns regarding medicines are outlined above.  Orders Placed This Encounter  Procedures   Ambulatory referral to Cardiac Electrophysiology   EKG 12-Lead   ECHOCARDIOGRAM COMPLETE    No orders of the defined types were placed in this encounter.    Patient Instructions  Medication Instructions:  Your physician recommends that you continue on your current medications as directed. Please refer to the Current Medication list given to you today.  *If you need a refill on your cardiac medications before your next appointment, please call your pharmacy*   Lab Work: None needed  If you have labs (blood work) drawn today and your tests are completely normal, you will receive your results only by: MyChart Message (if you have MyChart) OR A paper copy in the mail If you have any  lab test that is abnormal or we need to change your treatment, we will call you to review the results.   Testing/Procedures: Your physician has requested that you have an echocardiogram. Echocardiography is a painless test that uses sound waves to create images of your  heart. It provides your doctor with information about the size and shape of your heart and how well your heart's chambers and valves are working. This procedure takes approximately one hour. There are no restrictions for this procedure. Please do NOT wear cologne, perfume, aftershave, or lotions (deodorant is allowed). Please arrive 15 minutes prior to your appointment time.    Follow-Up: At [facility], you and your health needs are our priority.  As part of our continuing mission to provide you with exceptional heart care, we have created designated Provider Care Teams.  These Care Teams include your primary Cardiologist (physician) and Advanced Practice Providers (APPs -  Physician Assistants and Nurse Practitioners) who all work together to provide you with the care you need, when you need it.   Your next appointment:   6 month(s)  Provider:   Little Ishikawa, MD       Signed, Little Ishikawa, MD  03/06/2023 9:09 AM    [facility]

## 2023-03-06 ENCOUNTER — Ambulatory Visit: Payer: Medicare Other | Attending: Cardiology | Admitting: Cardiology

## 2023-03-06 VITALS — BP 116/60 | HR 46 | Ht 70.0 in | Wt 174.6 lb

## 2023-03-06 DIAGNOSIS — I77810 Thoracic aortic ectasia: Secondary | ICD-10-CM | POA: Insufficient documentation

## 2023-03-06 DIAGNOSIS — I350 Nonrheumatic aortic (valve) stenosis: Secondary | ICD-10-CM | POA: Diagnosis not present

## 2023-03-06 DIAGNOSIS — I2510 Atherosclerotic heart disease of native coronary artery without angina pectoris: Secondary | ICD-10-CM | POA: Diagnosis not present

## 2023-03-06 DIAGNOSIS — R001 Bradycardia, unspecified: Secondary | ICD-10-CM | POA: Insufficient documentation

## 2023-03-06 DIAGNOSIS — I10 Essential (primary) hypertension: Secondary | ICD-10-CM | POA: Insufficient documentation

## 2023-03-06 NOTE — Patient Instructions (Signed)
Medication Instructions:  Your physician recommends that you continue on your current medications as directed. Please refer to the Current Medication list given to you today.  *If you need a refill on your cardiac medications before your next appointment, please call your pharmacy*   Lab Work: None needed  If you have labs (blood work) drawn today and your tests are completely normal, you will receive your results only by: MyChart Message (if you have MyChart) OR A paper copy in the mail If you have any lab test that is abnormal or we need to change your treatment, we will call you to review the results.   Testing/Procedures: Your physician has requested that you have an echocardiogram. Echocardiography is a painless test that uses sound waves to create images of your heart. It provides your doctor with information about the size and shape of your heart and how well your heart's chambers and valves are working. This procedure takes approximately one hour. There are no restrictions for this procedure. Please do NOT wear cologne, perfume, aftershave, or lotions (deodorant is allowed). Please arrive 15 minutes prior to your appointment time.    Follow-Up: At [facility], you and your health needs are our priority.  As part of our continuing mission to provide you with exceptional heart care, we have created designated Provider Care Teams.  These Care Teams include your primary Cardiologist (physician) and Advanced Practice Providers (APPs -  Physician Assistants and Nurse Practitioners) who all work together to provide you with the care you need, when you need it.   Your next appointment:   6 month(s)  Provider:   Little Ishikawa, MD

## 2023-03-09 NOTE — Telephone Encounter (Signed)
B/P log placed in Dr.Schumann's folder for review.

## 2023-03-19 ENCOUNTER — Telehealth: Payer: Self-pay | Admitting: Cardiology

## 2023-03-19 DIAGNOSIS — Z006 Encounter for examination for normal comparison and control in clinical research program: Secondary | ICD-10-CM | POA: Diagnosis not present

## 2023-03-19 DIAGNOSIS — G309 Alzheimer's disease, unspecified: Secondary | ICD-10-CM | POA: Diagnosis not present

## 2023-03-19 DIAGNOSIS — F0280 Dementia in other diseases classified elsewhere without behavioral disturbance: Secondary | ICD-10-CM | POA: Diagnosis not present

## 2023-03-19 NOTE — Telephone Encounter (Signed)
Paper Work Dropped Off: Blood Pressure Report  Date: 03/19/2023  Location of paper:  Mailbox

## 2023-03-23 ENCOUNTER — Telehealth: Payer: Self-pay | Admitting: Cardiology

## 2023-03-23 NOTE — Telephone Encounter (Signed)
Routed to Cheryl LPN 

## 2023-03-23 NOTE — Telephone Encounter (Signed)
Paper Work Dropped Off: Blood pressure  Date:03/23/2023  Location of paper: Dr. Bjorn Pippin mailbox

## 2023-03-24 ENCOUNTER — Other Ambulatory Visit: Payer: Self-pay | Admitting: Cardiology

## 2023-03-24 NOTE — Telephone Encounter (Signed)
B/P readings given to Dr.Schumann.

## 2023-04-01 ENCOUNTER — Telehealth: Payer: Self-pay | Admitting: Cardiology

## 2023-04-01 NOTE — Telephone Encounter (Signed)
Paper Work Dropped Off: Blood Pressure Report  Date: 04/01/2023  Location of paper:  Provider Mailbox

## 2023-04-02 DIAGNOSIS — G309 Alzheimer's disease, unspecified: Secondary | ICD-10-CM | POA: Diagnosis not present

## 2023-04-02 DIAGNOSIS — F0280 Dementia in other diseases classified elsewhere without behavioral disturbance: Secondary | ICD-10-CM | POA: Diagnosis not present

## 2023-04-02 DIAGNOSIS — Z006 Encounter for examination for normal comparison and control in clinical research program: Secondary | ICD-10-CM | POA: Diagnosis not present

## 2023-04-03 DIAGNOSIS — Z23 Encounter for immunization: Secondary | ICD-10-CM | POA: Diagnosis not present

## 2023-04-03 NOTE — Telephone Encounter (Signed)
B/P readings put in Dr.Schumann's folder to review.

## 2023-04-07 DIAGNOSIS — G4733 Obstructive sleep apnea (adult) (pediatric): Secondary | ICD-10-CM | POA: Diagnosis not present

## 2023-04-15 ENCOUNTER — Telehealth: Payer: Self-pay | Admitting: Cardiology

## 2023-04-15 NOTE — Telephone Encounter (Signed)
Paper Work Dropped Off: Blood Pressure report  Date: 04/15/2023   Location of paper: Provider Mailbox

## 2023-04-16 DIAGNOSIS — F0280 Dementia in other diseases classified elsewhere without behavioral disturbance: Secondary | ICD-10-CM | POA: Diagnosis not present

## 2023-04-16 DIAGNOSIS — Z006 Encounter for examination for normal comparison and control in clinical research program: Secondary | ICD-10-CM | POA: Diagnosis not present

## 2023-04-16 DIAGNOSIS — G309 Alzheimer's disease, unspecified: Secondary | ICD-10-CM | POA: Diagnosis not present

## 2023-04-18 DIAGNOSIS — Z23 Encounter for immunization: Secondary | ICD-10-CM | POA: Diagnosis not present

## 2023-04-20 ENCOUNTER — Telehealth: Payer: Self-pay | Admitting: Cardiology

## 2023-04-20 NOTE — Telephone Encounter (Signed)
Paper Work Dropped Off: Blood Pressure Readings   Date: 04/20/2023  Location of paper:  Provider Mailbox

## 2023-04-22 ENCOUNTER — Other Ambulatory Visit: Payer: Self-pay | Admitting: Cardiology

## 2023-04-24 NOTE — Telephone Encounter (Signed)
B/P readings placed in Dr.Schumann's folder for review.

## 2023-04-27 ENCOUNTER — Telehealth: Payer: Self-pay | Admitting: Cardiology

## 2023-04-27 NOTE — Telephone Encounter (Signed)
Patient dropped off blood pressure report In providers box

## 2023-04-28 NOTE — Telephone Encounter (Signed)
B/P readings placed in Dr.Schumann's folder for review.

## 2023-04-30 DIAGNOSIS — Z006 Encounter for examination for normal comparison and control in clinical research program: Secondary | ICD-10-CM | POA: Diagnosis not present

## 2023-04-30 DIAGNOSIS — F0280 Dementia in other diseases classified elsewhere without behavioral disturbance: Secondary | ICD-10-CM | POA: Diagnosis not present

## 2023-04-30 DIAGNOSIS — G309 Alzheimer's disease, unspecified: Secondary | ICD-10-CM | POA: Diagnosis not present

## 2023-05-04 ENCOUNTER — Telehealth: Payer: Self-pay | Admitting: Cardiology

## 2023-05-04 NOTE — Telephone Encounter (Signed)
Patient dropped off blood pressure report In providers box

## 2023-05-05 NOTE — Telephone Encounter (Signed)
B/P readings placed in Dr.Schumann's folder for review.

## 2023-05-13 ENCOUNTER — Telehealth: Payer: Self-pay | Admitting: Cardiology

## 2023-05-13 NOTE — Telephone Encounter (Signed)
Paper Work Dropped Off: Blood Pressure Reading   Date:05/13/2023  Location of paper:  Dr. Dot Been mailbox

## 2023-05-14 ENCOUNTER — Encounter: Payer: Self-pay | Admitting: Internal Medicine

## 2023-05-14 ENCOUNTER — Ambulatory Visit: Payer: Medicare Other | Attending: Internal Medicine | Admitting: Internal Medicine

## 2023-05-14 VITALS — BP 140/70 | HR 48 | Ht 70.0 in | Wt 177.8 lb

## 2023-05-14 DIAGNOSIS — Z006 Encounter for examination for normal comparison and control in clinical research program: Secondary | ICD-10-CM | POA: Diagnosis not present

## 2023-05-14 DIAGNOSIS — G309 Alzheimer's disease, unspecified: Secondary | ICD-10-CM | POA: Diagnosis not present

## 2023-05-14 DIAGNOSIS — R001 Bradycardia, unspecified: Secondary | ICD-10-CM | POA: Diagnosis not present

## 2023-05-14 DIAGNOSIS — I517 Cardiomegaly: Secondary | ICD-10-CM | POA: Diagnosis not present

## 2023-05-14 DIAGNOSIS — F0280 Dementia in other diseases classified elsewhere without behavioral disturbance: Secondary | ICD-10-CM | POA: Diagnosis not present

## 2023-05-14 NOTE — Patient Instructions (Addendum)
Medication Instructions:  Your physician recommends that you continue on your current medications as directed. Please refer to the Current Medication list given to you today.  *If you need a refill on your cardiac medications before your next appointment, please call your pharmacy*  Lab Work: None ordered.  If you have labs (blood work) drawn today and your tests are completely normal, you will receive your results only by: MyChart Message (if you have MyChart) OR A paper copy in the mail If you have any lab test that is abnormal or we need to change your treatment, we will call you to review the results.  Testing/Procedures: None ordered.  Follow-Up: At [facility], you and your health needs are our priority.  As part of our continuing mission to provide you with exceptional heart care, we have created designated Provider Care Teams.  These Care Teams include your primary Cardiologist (physician) and Advanced Practice Providers (APPs -  Physician Assistants and Nurse Practitioners) who all work together to provide you with the care you need, when you need it.   Your next appointment:   As needed  The format for your next appointment:   In Person  Provider:   Lewayne Bunting, MD{or one of the following Advanced Practice Providers on your designated Care Team:   Francis Dowse, [place] Casimiro Needle "Mardelle Matte" [place], [place] Earnest Rosier, NP  Remote monitoring is used to monitor your Pacemaker/ ICD from home. This monitoring reduces the number of office visits required to check your device to one time per year. It allows [place] to keep an eye on the functioning of your device to ensure it is working properly.   Important Information About Sugar

## 2023-05-14 NOTE — Progress Notes (Signed)
HPI Albert Pierce returns today for followup. He is a pleasant 77 yo man with a h/o HTN, who developed sinus bradycardia. The patient has not had syncope. He is not limited by activity and feels well. He has mild AS. He has asymetric hypertrophy and a 16 m m septum. He denies syncope. He is undergoing infusion for his dementia with a monoclonal antibody. His wife who is with him notes that at his infusions his HR will at times go into the high 30's.  No Known Allergies   Current Outpatient Medications  Medication Sig Dispense Refill   amLODipine (NORVASC) 10 MG tablet Take 10 mg by mouth daily. Take 1 Tablets by mouth Daily     aspirin 81 MG tablet Take 81 mg by mouth daily.     Cyanocobalamin (VITAMIN B12) 1000 MCG TBCR Take 2,000 mcg by mouth daily.     donepezil (ARICEPT) 5 MG tablet Take 5 mg by mouth at bedtime.     fish oil-omega-3 fatty acids 1000 MG capsule Take 3,000 mg by mouth daily.     rosuvastatin (CRESTOR) 20 MG tablet TAKE 1 TABLET EVERY DAY 90 tablet 3   Turmeric 500 MG TABS Take by mouth.     valsartan (DIOVAN) 320 MG tablet TAKE 1 TABLET EVERY DAY 90 tablet 3   No current facility-administered medications for this visit.     Past Medical History:  Diagnosis Date   Appendicitis    Arthritis    Gout    Hyperlipidemia    Hypertension    Short-term memory loss 2019   mild    ROS:   All systems reviewed and negative except as noted in the HPI.   Past Surgical History:  Procedure Laterality Date   APPENDECTOMY     77 y.o.   COLONOSCOPY     EYE MUSCLE SURGERY     UMBILICAL HERNIA REPAIR  2016   Dr Derrell Lolling     Family History  Problem Relation Age of Onset   Parkinson's disease Mother    Tuberculosis Maternal Grandfather    Tuberculosis Paternal Grandfather    Parkinson's disease Maternal Aunt    Parkinson's disease Maternal Uncle    Dementia Neg Hx    Alzheimer's disease Neg Hx    Lung cancer Neg Hx      Social History   Socioeconomic  History   Marital status: Married    Spouse name: Not on file   Number of children: 3   Years of education: 21   Highest education level: Professional school degree (e.g., MD, DDS, DVM, JD)  Occupational History   Not on file  Tobacco Use   Smoking status: Never   Smokeless tobacco: Never  Substance and Sexual Activity   Alcohol use: Yes    Alcohol/week: 2.0 - 8.0 standard drinks of alcohol    Types: 2 - 8 Standard drinks or equivalent per week    Comment: generally wine, occ beer or hard liquor    Drug use: No   Sexual activity: Not on file  Other Topics Concern   Not on file  Social History Narrative   Lives at home with wife   Right handed   Caffeine: diet coke, 2-4 cups/day   Social Determinants of Health   Financial Resource Strain: Not on file  Food Insecurity: Not on file  Transportation Needs: Not on file  Physical Activity: Not on file  Stress: Not on file  Social Connections: Not on  file  Intimate Partner Violence: Not on file     BP (!) 140/70 (BP Location: Left Arm, Patient Position: Sitting, Cuff Size: Normal)   Pulse (!) 48   Ht 5\' 10"  (1.778 m)   Wt 177 lb 12.8 oz (80.6 kg)   SpO2 97%   BMI 25.51 kg/m   Physical Exam:  Well appearing NAD HEENT: Unremarkable Neck:  No JVD, no thyromegally Lymphatics:  No adenopathy Back:  No CVA tenderness Lungs:  Clear HEART:  Regular rate rhythm, no murmurs, no rubs, no clicks Abd:  soft, positive bowel sounds, no organomegally, no rebound, no guarding Ext:  2 plus pulses, no edema, no cyanosis, no clubbing Skin:  No rashes no nodules Neuro:  CN II through XII intact, motor grossly intact  EKG - sinus brady at 48 with LHV and left axis.  Assess/Plan: Sinus node dysfunction - He is not symptomatic. I discussed the symptoms he might experience with the patient and his wife. They will undergo watchful waiting.  Dementia - he is still undergoing infusion therapy and appears stable on my exam today.   Sharlot Gowda  Macauley Mossberg,MD

## 2023-05-19 ENCOUNTER — Telehealth: Payer: Self-pay | Admitting: Cardiology

## 2023-05-19 DIAGNOSIS — G309 Alzheimer's disease, unspecified: Secondary | ICD-10-CM | POA: Diagnosis not present

## 2023-05-19 DIAGNOSIS — F0280 Dementia in other diseases classified elsewhere without behavioral disturbance: Secondary | ICD-10-CM | POA: Diagnosis not present

## 2023-05-19 NOTE — Telephone Encounter (Signed)
Patient dropped off blood pressure report In providers box

## 2023-05-19 NOTE — Telephone Encounter (Signed)
Received B/P readings.Placed in Dr.Schumann's folder for review.

## 2023-05-20 DIAGNOSIS — G309 Alzheimer's disease, unspecified: Secondary | ICD-10-CM | POA: Diagnosis not present

## 2023-05-20 DIAGNOSIS — F02A0 Dementia in other diseases classified elsewhere, mild, without behavioral disturbance, psychotic disturbance, mood disturbance, and anxiety: Secondary | ICD-10-CM | POA: Diagnosis not present

## 2023-05-20 DIAGNOSIS — F0280 Dementia in other diseases classified elsewhere without behavioral disturbance: Secondary | ICD-10-CM | POA: Diagnosis not present

## 2023-05-25 ENCOUNTER — Telehealth: Payer: Self-pay | Admitting: Cardiology

## 2023-05-25 NOTE — Telephone Encounter (Signed)
Paper Work Dropped Off: BP LOG  Date: 11.11.24  Location of paper:  MD Box

## 2023-05-25 NOTE — Telephone Encounter (Signed)
B/P readings placed in Dr.Schumann's folder for review.

## 2023-05-28 DIAGNOSIS — Z006 Encounter for examination for normal comparison and control in clinical research program: Secondary | ICD-10-CM | POA: Diagnosis not present

## 2023-05-28 DIAGNOSIS — F0280 Dementia in other diseases classified elsewhere without behavioral disturbance: Secondary | ICD-10-CM | POA: Diagnosis not present

## 2023-05-28 DIAGNOSIS — G309 Alzheimer's disease, unspecified: Secondary | ICD-10-CM | POA: Diagnosis not present

## 2023-06-01 ENCOUNTER — Telehealth: Payer: Self-pay | Admitting: Cardiology

## 2023-06-01 NOTE — Telephone Encounter (Signed)
Patient dropped off blood pressure report In providers box

## 2023-06-09 ENCOUNTER — Telehealth: Payer: Self-pay | Admitting: Cardiology

## 2023-06-09 NOTE — Telephone Encounter (Signed)
Paper Work Dropped Off: BP log   Date: 06/09/23   Location of paper:  MD BOX

## 2023-06-15 ENCOUNTER — Telehealth: Payer: Self-pay | Admitting: Cardiology

## 2023-06-15 ENCOUNTER — Encounter: Payer: Self-pay | Admitting: *Deleted

## 2023-06-15 NOTE — Telephone Encounter (Signed)
Pt dropped of BP report In providers box

## 2023-06-23 ENCOUNTER — Telehealth: Payer: Self-pay | Admitting: Cardiology

## 2023-06-23 NOTE — Telephone Encounter (Signed)
Paper Work Dropped Off: Blood pressure Report  Date:06/23/23  Location of paper:  Dr. Bjorn Pippin mail box

## 2023-06-25 DIAGNOSIS — Z006 Encounter for examination for normal comparison and control in clinical research program: Secondary | ICD-10-CM | POA: Diagnosis not present

## 2023-06-25 DIAGNOSIS — G309 Alzheimer's disease, unspecified: Secondary | ICD-10-CM | POA: Diagnosis not present

## 2023-06-25 DIAGNOSIS — F0280 Dementia in other diseases classified elsewhere without behavioral disturbance: Secondary | ICD-10-CM | POA: Diagnosis not present

## 2023-06-30 ENCOUNTER — Encounter: Payer: Self-pay | Admitting: *Deleted

## 2023-06-30 ENCOUNTER — Telehealth: Payer: Self-pay | Admitting: Cardiology

## 2023-06-30 NOTE — Telephone Encounter (Signed)
Paper Work Dropped Off: Blood Pressure Report  Date: 12/116/2024  Location of paper:  Placed in Dr. Bjorn Pippin MailBox

## 2023-07-09 ENCOUNTER — Telehealth: Payer: Self-pay | Admitting: Cardiology

## 2023-07-09 DIAGNOSIS — Z006 Encounter for examination for normal comparison and control in clinical research program: Secondary | ICD-10-CM | POA: Diagnosis not present

## 2023-07-09 DIAGNOSIS — G309 Alzheimer's disease, unspecified: Secondary | ICD-10-CM | POA: Diagnosis not present

## 2023-07-09 DIAGNOSIS — F0280 Dementia in other diseases classified elsewhere without behavioral disturbance: Secondary | ICD-10-CM | POA: Diagnosis not present

## 2023-07-09 NOTE — Telephone Encounter (Signed)
Pt came in office and dropped off his BP log. Placing in provider's mail box

## 2023-07-16 ENCOUNTER — Telehealth: Payer: Self-pay | Admitting: Cardiology

## 2023-07-16 NOTE — Telephone Encounter (Signed)
 Paper Work Dropped Off: BP log  Date: 07/12/23  Location of paper:  MD BOX

## 2023-07-20 ENCOUNTER — Telehealth: Payer: Self-pay | Admitting: Cardiology

## 2023-07-20 ENCOUNTER — Encounter: Payer: Self-pay | Admitting: *Deleted

## 2023-07-20 NOTE — Telephone Encounter (Signed)
 Paper Work Dropped Off: pt dropped off blood pressure readings  Date:07/20/2023  Location of paper:  Dr. Bjorn Pippin mailbox

## 2023-07-21 ENCOUNTER — Encounter: Payer: Self-pay | Admitting: *Deleted

## 2023-07-28 ENCOUNTER — Telehealth: Payer: Self-pay | Admitting: Cardiology

## 2023-07-28 NOTE — Telephone Encounter (Signed)
 Paper Work Dropped Off: BP report   Date: 07/28/2023  Location of paper:  Dr. Campbell Lerner box

## 2023-07-31 ENCOUNTER — Encounter: Payer: Self-pay | Admitting: *Deleted

## 2023-08-04 ENCOUNTER — Telehealth: Payer: Self-pay | Admitting: General Practice

## 2023-08-04 NOTE — Telephone Encounter (Signed)
Paper Work Dropped Off: Blood pressure Reading  Date: 08/04/23  Location of paper:  Dr. Molli Hazard box

## 2023-08-05 NOTE — Telephone Encounter (Signed)
Reviewed.  Please scan into his chart.  Thank you.  Albert Pierce. Parys Elenbaas NP-C     08/05/2023, 4:02 PM [facility] [address] Office [phone] Fax [phone]

## 2023-08-05 NOTE — Telephone Encounter (Signed)
Received BP/HR log. Given to Edd Fabian, FNP-C

## 2023-08-11 ENCOUNTER — Telehealth: Payer: Self-pay | Admitting: Cardiology

## 2023-08-11 NOTE — Telephone Encounter (Signed)
Patient dropped of BP reading In providers box

## 2023-08-12 ENCOUNTER — Encounter: Payer: Self-pay | Admitting: *Deleted

## 2023-08-24 ENCOUNTER — Telehealth: Payer: Self-pay | Admitting: Cardiology

## 2023-08-24 ENCOUNTER — Encounter: Payer: Self-pay | Admitting: *Deleted

## 2023-08-24 NOTE — Telephone Encounter (Signed)
 Pt dropped of bp report In providers box

## 2023-09-02 ENCOUNTER — Ambulatory Visit (HOSPITAL_COMMUNITY): Payer: Medicare Other | Attending: Internal Medicine

## 2023-09-02 DIAGNOSIS — I350 Nonrheumatic aortic (valve) stenosis: Secondary | ICD-10-CM | POA: Diagnosis not present

## 2023-09-02 DIAGNOSIS — I77810 Thoracic aortic ectasia: Secondary | ICD-10-CM | POA: Insufficient documentation

## 2023-09-02 LAB — ECHOCARDIOGRAM COMPLETE
AR max vel: 1.38 cm2
AV Area VTI: 1.48 cm2
AV Area mean vel: 1.45 cm2
AV Mean grad: 17 mm[Hg]
AV Peak grad: 33.9 mm[Hg]
Ao pk vel: 2.91 m/s
Area-P 1/2: 2.39 cm2
P 1/2 time: 683 ms
S' Lateral: 3 cm

## 2023-09-03 NOTE — Progress Notes (Unsigned)
 Cardiology Office Note:    Date:  09/04/2023  ID:  Albert Pierce, DOB 11-Sep-1945, MRN [id number] PCP: Chilton Greathouse, MD  [facility] Providers Cardiologist:  Little Ishikawa, MD       Patient Profile:      Albert Pierce is a 78 y.o. male with visit-pertinent history of hypertension, mild aortic stenosis  He established care with cardiology service July 2021 for evaluation of bradycardia.  He was noted at the clinic visit as sinus bradycardia with a rate of 46 bpm.  Atenolol discontinued at that time.  He brought a log of his BP/pulse with them and pulse have been as low as 39 at home. Echocardiogram August 2021 showed normal biventricular function, moderate focal basal septal LVH, grade 1 diastolic dysfunction, mild aortic stenosis, mild dilation of ascending aorta measuring 3 mm.  Zio patch x 3 days on August 2021 showed no significant arrhythmia, average heart rate 60 bpm.  Cardiac MRI on September 2021 showed asymmetric hypertrophy measuring up to 16 mm and basal septum (8 mm and posterior wall), meeting criteria for HCM but patient's AAS and hypertension could also be causes of asymmetric hypertrophy: No LGE, LVEF 72%, RVEF 63%, mild AI, ascending aortic dilation measuring 40 mm.  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  MRI chest on September 2022 showed ascending aorta dilation measuring 40 mm.  Lexiscan Myoview on January 2023 showed normal perfusion, EF 54%.  Echocardiogram September 2023 showed EF 60-65%, grade 2 diastolic dysfunction, normal RV function, moderate left atrial enlargement, mild mitral regurgitation, mild aortic stenosis/aortic regurgitation, mild dilation of ascending aorta measuring 41 mm.  Zio patch x 3 days on October 2023 showed no significant arrhythmias.  He was seen in clinic on 09/05/2022.  He was doing well at that time.  No medication changes made.  Follow-up in 6  months.      History of Present Illness:  Discussed the use of AI scribe software for clinical note transcription with the patient, who gave verbal consent to proceed.  Albert Pierce is a 78 y.o. male who returns for 28-month follow-up for CAD.  Patient arrives to clinic today with his wife.  He denies any cardiovascular concerns or complaints since last clinic visit.  He reports no new symptoms and continues to be asymptomatic despite heart rate consistently in the 40s at home. He has been maintaining a somewhat active lifestyle, engaging in activities such as walking the dog, doing yard work, and seeing a trainer twice a week.  He denies any chest pain, dyspnea, syncope, dizziness, lightheadedness, lower extremity edema, palpitations.  He is without any exertional symptoms.    Review of Systems  Constitutional: Negative for weight gain and weight loss.  Cardiovascular:  Negative for chest pain, claudication, dyspnea on exertion, irregular heartbeat, leg swelling, near-syncope, orthopnea, palpitations, paroxysmal nocturnal dyspnea and syncope.  Respiratory:  Negative for cough, hemoptysis and shortness of breath.   Gastrointestinal:  Negative for abdominal pain, hematochezia and melena.  Genitourinary:  Negative for hematuria.  Neurological:  Negative for dizziness and light-headedness.     See HPI     Home Medications:    Prior to Admission medications   Medication Sig Start Date End Date Taking? Authorizing Provider  amLODipine (NORVASC) 10 MG tablet Take 10 mg by mouth daily. Take 1 Tablets by mouth Daily    [provider]  aspirin 81 MG tablet Take 81 mg  by mouth daily.    [provider]  Cyanocobalamin (VITAMIN B12) 1000 MCG TBCR Take 2,000 mcg by mouth daily.    [provider]  donepezil (ARICEPT) 5 MG tablet Take 5 mg by mouth at bedtime. 05/14/21   [provider]  fish oil-omega-3 fatty acids 1000 MG capsule Take 3,000 mg by mouth daily.     [provider]  rosuvastatin (CRESTOR) 20 MG tablet TAKE 1 TABLET EVERY DAY 03/24/23   Little Ishikawa, MD  Turmeric 500 MG TABS Take by mouth.    [provider]  valsartan (DIOVAN) 320 MG tablet TAKE 1 TABLET EVERY DAY 04/22/23   Little Ishikawa, MD   Studies Reviewed:       Echocardiogram 09/02/2023  1. Left ventricular ejection fraction, by estimation, is 60 to 65%. Left  ventricular ejection fraction by 3D volume is 65 %. The left ventricle has  normal function. The left ventricle has no regional wall motion  abnormalities. There is mild left  ventricular hypertrophy. Left ventricular diastolic parameters are  consistent with Grade I diastolic dysfunction (impaired relaxation). The  average left ventricular global longitudinal strain is -21.7 %. The global  longitudinal strain is normal.   2. Right ventricular systolic function is normal. The right ventricular  size is normal. Tricuspid regurgitation signal is inadequate for assessing  PA pressure.   3. Left atrial size was mildly dilated.   4. The mitral valve is grossly normal. Trivial mitral valve  regurgitation. No evidence of mitral stenosis.   5. The aortic valve is abnormal. There is mild calcification of the  aortic valve. There is mild thickening of the aortic valve. Aortic valve  regurgitation is mild. Mild aortic valve stenosis. Aortic valve area, by  VTI measures 1.48 cm. Aortic valve  mean gradient measures 17.0 mmHg. Aortic valve Vmax measures 2.91 m/s.   6. Aortic dilatation noted. There is mild dilatation of the ascending  aorta, measuring 43 mm.   7. The inferior vena cava is normal in size with greater than 50%  respiratory variability, suggesting right atrial pressure of 3 mmHg.  Risk Assessment/Calculations:             Physical Exam:   VS:  BP 132/78 (BP Location: Left Arm, Patient Position: Sitting, Cuff Size: Normal)   Pulse (!) 45   Ht 5\' 10"  (1.778 m)   Wt 174 lb  (78.9 kg)   SpO2 100%   BMI 24.97 kg/m    Wt Readings from Last 3 Encounters:  09/04/23 174 lb (78.9 kg)  05/14/23 177 lb 12.8 oz (80.6 kg)  03/06/23 174 lb 9.6 oz (79.2 kg)    Constitutional:      Appearance: Normal and healthy appearance. Not in distress.  Neck:     Vascular: JVD normal.  Pulmonary:     Effort: Pulmonary effort is normal.     Breath sounds: Normal breath sounds.  Chest:     Chest wall: Not tender to palpatation.  Cardiovascular:     PMI at left midclavicular line. Normal rate. Regular rhythm. Normal S1. Normal S2.      Murmurs: There is a grade 2/6 systolic murmur.     No gallop.  No click. No rub.  Pulses:    Intact distal pulses.  Edema:    Peripheral edema absent.  Musculoskeletal: Normal range of motion.     Cervical back: Normal range of motion and neck supple. Skin:    General: Skin is  warm and dry.  Neurological:     General: No focal deficit present.     Mental Status: Alert, oriented to person, place, and time and oriented to person, place and time.  Psychiatric:        Mood and Affect: Mood and affect normal.        Behavior: Behavior is cooperative.        Thought Content: Thought content normal.        Assessment and Plan:  Sinus bradycardia Heart rate was as low as high 30s at home initial clinic visit 01/2020.  Atenolol was discontinued at that time.  Zio patch 02/2020 after discontinuing atenolol showed no significant bradycardia/arrhythmia, average heart rate 60 bpm.  Had marked sinus bradycardia to 30s at clinic visit 02/2022.   Zio patch  x3 days on 04/2022 showed average heart rate of 51 bpm, no significant arrhythmias. -Today heart rate is 45 bpm.  Overall asymptomatic and he is without any lightheadedness, dizziness, syncope, presyncope -Managed by EP, Dr. Ladona Ridgel and patient is currently under watchful waiting -Educated patient on S/S of bradycardia  Coronary artery disease Coronary calcium score of 1450 (87 percentile) on  02/2021 Lexiscan Myoview 07/2021 showed normal perfusion, EF 54%. -Today and since he was last seen in clinic he has been without any anginal symptoms, no indication for further ischemic evaluation at this time -Continue aspirin 81 mg daily and rosuvastatin 20 mg daily  Left ventricular hypertrophy Echocardiogram 02/2020 showed moderate focal basal septal LVH.  Cardiac MRI on 03/2020 showed asymmetric hypertrophy measuring up to 16 mm in basal septum (8 mm in posterior wall), meeting criteria for HCM. However the patient's AS and hypertension could also be causes of asymmetric hypertrophy. No LGE on MRI.   Hypertension Blood pressure today 132/78 and under good control -Continue monitoring blood pressure at home -Continue amlodipine 10 mg daily and valsartan 320 mg daily  Aortic stenosis / Aortic regurgitation Most recent echocardiogram 09/02/2023 showed mild AI/mild AS.  Echo 03/2022 showed mild AI/mild AS. -Recommend repeat echo in 1 year for routine surveillance  Ascending aorta dilation Most recent echocardiogram 09/02/2023 showed mild dilation of ascending aorta measuring 43 mm.  Measured 41 mm on echo 03/2022. -Recommend repeat echo in 1 year for routine surveillance  Hyperlipidemia LDL 59, TC 132, HDL 59 on 02/11/1913 LDL under excellent control and under goal of less than 70 -Continue heart healthy dieting and exercise -Continue rosuvastatin 20 mg daily  OSA Reports continued adherence to CPAP  Alzheimer's disease  Managed by neurology              Dispo:  Return in about 6 months (around 03/03/2024).  Signed, Denyce Robert, NP

## 2023-09-04 ENCOUNTER — Encounter: Payer: Self-pay | Admitting: Emergency Medicine

## 2023-09-04 ENCOUNTER — Ambulatory Visit: Payer: Medicare Other | Attending: General Practice | Admitting: Emergency Medicine

## 2023-09-04 ENCOUNTER — Other Ambulatory Visit: Payer: Self-pay | Admitting: *Deleted

## 2023-09-04 ENCOUNTER — Encounter: Payer: Self-pay | Admitting: *Deleted

## 2023-09-04 VITALS — BP 132/78 | HR 45 | Ht 70.0 in | Wt 174.0 lb

## 2023-09-04 DIAGNOSIS — I350 Nonrheumatic aortic (valve) stenosis: Secondary | ICD-10-CM

## 2023-09-04 DIAGNOSIS — G4733 Obstructive sleep apnea (adult) (pediatric): Secondary | ICD-10-CM | POA: Insufficient documentation

## 2023-09-04 DIAGNOSIS — E785 Hyperlipidemia, unspecified: Secondary | ICD-10-CM | POA: Insufficient documentation

## 2023-09-04 DIAGNOSIS — I77819 Aortic ectasia, unspecified site: Secondary | ICD-10-CM

## 2023-09-04 DIAGNOSIS — I10 Essential (primary) hypertension: Secondary | ICD-10-CM | POA: Insufficient documentation

## 2023-09-04 DIAGNOSIS — I2510 Atherosclerotic heart disease of native coronary artery without angina pectoris: Secondary | ICD-10-CM | POA: Insufficient documentation

## 2023-09-04 DIAGNOSIS — R001 Bradycardia, unspecified: Secondary | ICD-10-CM | POA: Insufficient documentation

## 2023-09-04 DIAGNOSIS — I77810 Thoracic aortic ectasia: Secondary | ICD-10-CM | POA: Insufficient documentation

## 2023-09-04 DIAGNOSIS — I351 Nonrheumatic aortic (valve) insufficiency: Secondary | ICD-10-CM | POA: Diagnosis present

## 2023-09-04 DIAGNOSIS — I517 Cardiomegaly: Secondary | ICD-10-CM | POA: Diagnosis present

## 2023-09-04 NOTE — Patient Instructions (Signed)
 Medication Instructions:  The current medical regimen is effective;  continue present plan and medications as directed. Please refer to the Current Medication list given to you today.  *If you need a refill on your cardiac medications before your next appointment, please call your pharmacy*  Lab Work: NONE  Testing/Procedures: NONE   Follow-Up: At [facility], you and your health needs are our priority.  As part of our continuing mission to provide you with exceptional heart care, we have created designated Provider Care Teams.  These Care Teams include your primary Cardiologist (physician) and Advanced Practice Providers (APPs -  Physician Assistants and Nurse Practitioners) who all work together to provide you with the care you need, when you need it.  Your next appointment:   6 month(s)  Provider:   Little Ishikawa, MD

## 2023-09-07 ENCOUNTER — Telehealth: Payer: Self-pay | Admitting: Cardiology

## 2023-09-07 NOTE — Telephone Encounter (Signed)
 Pt dropped off BP report In providers box

## 2023-09-08 ENCOUNTER — Encounter: Payer: Self-pay | Admitting: *Deleted

## 2023-09-15 ENCOUNTER — Telehealth: Payer: Self-pay | Admitting: Cardiology

## 2023-09-15 NOTE — Telephone Encounter (Signed)
 Pt dropped of BP report In providers box

## 2023-09-21 ENCOUNTER — Telehealth: Payer: Self-pay | Admitting: Cardiology

## 2023-09-21 NOTE — Telephone Encounter (Signed)
B/P readings placed in Dr.Schumann's folder for review.

## 2023-09-21 NOTE — Telephone Encounter (Signed)
 Patient brought in BP reading. Will leave in provider box.

## 2023-10-05 ENCOUNTER — Telehealth: Payer: Self-pay | Admitting: Cardiology

## 2023-10-05 NOTE — Telephone Encounter (Signed)
 Paper Work Dropped Off:  BP log    Date: 10/05/2023  Location of paper:  Dr. Bjorn Pippin Box

## 2023-10-12 ENCOUNTER — Encounter: Payer: Self-pay | Admitting: *Deleted

## 2023-10-12 NOTE — Telephone Encounter (Signed)
 Called and left message that we received blood pressure log and will forward to Dr. Bjorn Pippin. Left message to call office for any questions.

## 2023-11-23 ENCOUNTER — Telehealth: Payer: Self-pay | Admitting: Cardiology

## 2023-11-23 NOTE — Telephone Encounter (Signed)
 Paper Work Dropped Off: Blood pressure reading  Date:11/23/2023  Location of paper: Dr Alda Amas mail box

## 2023-11-30 ENCOUNTER — Telehealth: Payer: Self-pay | Admitting: Cardiology

## 2023-11-30 NOTE — Telephone Encounter (Signed)
 Paper Work Dropped Off: BP Readings  Date: 11/30/2023  Location of paper: Provider Mailbox

## 2023-12-04 NOTE — Telephone Encounter (Signed)
 Called and made patient aware that we received his blood pressure and this will be give to provider.

## 2023-12-21 ENCOUNTER — Telehealth: Payer: Self-pay | Admitting: Cardiology

## 2023-12-21 NOTE — Telephone Encounter (Signed)
 Patient brought in BP reading. Will leave in provider box. Thank You!

## 2024-01-05 ENCOUNTER — Telehealth: Payer: Self-pay | Admitting: Cardiology

## 2024-01-05 NOTE — Telephone Encounter (Signed)
 Paper Work Dropped Off: Blood Pressure Readings  Date: 01/05/2024  Location of paper:  Placed in Dr. Alvan Mailbox

## 2024-01-08 ENCOUNTER — Ambulatory Visit (HOSPITAL_COMMUNITY)
Admission: RE | Admit: 2024-01-08 | Discharge: 2024-01-08 | Disposition: A | Discharge status: Home / Self Care | Source: Ambulatory Visit | Attending: Vascular Surgery

## 2024-01-08 ENCOUNTER — Other Ambulatory Visit (HOSPITAL_COMMUNITY): Payer: Self-pay | Admitting: Family Medicine

## 2024-01-08 DIAGNOSIS — M79605 Pain in left leg: Secondary | ICD-10-CM | POA: Diagnosis present

## 2024-01-08 DIAGNOSIS — M7989 Other specified soft tissue disorders: Secondary | ICD-10-CM

## 2024-01-11 ENCOUNTER — Telehealth: Payer: Self-pay | Admitting: Cardiology

## 2024-01-11 NOTE — Telephone Encounter (Signed)
 Paper Work Dropped Off: BP readings  Date: 01-11-24  Location of paper:  Dr. Alvan mailbox

## 2024-02-29 ENCOUNTER — Telehealth: Payer: Self-pay | Admitting: Cardiology

## 2024-02-29 NOTE — Telephone Encounter (Signed)
 Pt dropped off blood pressure recording for Dr. Kate to review.   Location: Dr. Kate mailbox

## 2024-03-03 NOTE — Progress Notes (Unsigned)
 Cardiology Office Note:    Date:  03/04/2024   ID:  Albert Pierce, DOB [birth year], MRN [id number]  PCP:  Janey Santos, MD  Cardiologist:  Lonni LITTIE Nanas, MD  Electrophysiologist:  None   Referring MD: Janey Santos, MD   Chief Complaint  Patient presents with   Bradycardia    History of Present Illness:    Albert Pierce is a 78 y.o. male with a hx of hypertension, mild aortic stenosis who presents for follow-up.  He was referred by Izetta Cedar, PA for evaluation of bradycardia, initially seen on 01/25/2020.  He was noted at clinic visit to have sinus bradycardia with rate 46.  He brought a log of his BP/pulse with him, and pulse has been as low as 39 at home.  Current medications include atenolol  25 mg daily, amlodipine  10 mg daily, and telmisartan 80 mg daily.  He denies any lightheadedness, syncope, fatigue, chest pain, dyspnea, or palpitations.  States that he exercises by walking his dog about 4 times per week for 20 to 30 minutes.  At initial clinic on 01/25/2020, and Tylenol was discontinued.  Echocardiogram on 02/13/2020 showed normal biventricular function, moderate focal basal septal LVH, grade 1 diastolic dysfunction, mild aortic stenosis, mild dilatation of the ascending aorta measuring 3 mm.  Zio patch x3 days on 02/28/2020 showed no significant arrhythmia, average heart rate 60 bpm.  Cardiac MRI on 04/04/2020 showed asymmetric hypertrophy measuring up to 16 mm and basal septum (8 mm and posterior wall), meeting criteria for HCM but patient's AS and hypertension could also be causes of asymmetric hypertrophy; no LGE, LVEF 72%, RVEF 63%, mild AI (regurgitant fraction 14%), ascending aortic dilatation measuring 40 mm.  Calcium  score 1450 on 03/08/2021 (87th percentile); also noted to have bronchiectasis, referred to pulmonology.  MRA chest on 04/11/2021 showed ascending aortic dilatation measuring 40 mm.  Lexiscan  Myoview  on 07/25/2021 showed normal perfusion, EF 54%.   Echocardiogram 03/18/2022 showed EF 60 to 65%, grade 2 diastolic dysfunction, normal RV function, moderate left atrial enlargement, mild mitral regurgitation, mild aortic stenosis/aortic regurgitation, mild dilatation of ascending aorta measuring 41 mm.  Zio patch x 3 days on 04/2022 showed no significant arrhythmias.  Echocardiogram 08/2023 showed EF 60 to 65%, normal RV function, mild aortic stenosis/mild aortic regurgitation.  Since last clinic visit, he reports he is doing well.  Denies any chest pain, dyspnea, lightheadedness, syncope, or palpitations.  Did have some swelling in ankle, no DVT on duplex 12/2023.  Reports swelling has resolved.   Past Medical History:  Diagnosis Date   Appendicitis    Arthritis    Gout    Hyperlipidemia    Hypertension    Short-term memory loss 2019   mild    Past Surgical History:  Procedure Laterality Date   APPENDECTOMY     78 y.o.   COLONOSCOPY     EYE MUSCLE SURGERY     UMBILICAL HERNIA REPAIR  2016   Dr Gail    Current Medications: Current Meds  Medication Sig   amLODipine  (NORVASC ) 10 MG tablet Take 10 mg by mouth daily. Take 1 Tablets by mouth Daily   aspirin 81 MG tablet Take 81 mg by mouth daily.   Cyanocobalamin (VITAMIN B12) 1000 MCG TBCR Take 2,000 mcg by mouth daily.   donepezil (ARICEPT) 5 MG tablet Take 5 mg by mouth at bedtime.   fish oil-omega-3 fatty acids 1000 MG capsule Take 3,000 mg by mouth daily.   rosuvastatin  (CRESTOR ) 20 MG  tablet TAKE 1 TABLET EVERY DAY   Turmeric 500 MG TABS Take by mouth.   valsartan  (DIOVAN ) 320 MG tablet TAKE 1 TABLET EVERY DAY     Allergies:   Patient has no known allergies.   Social History   Socioeconomic History   Marital status: Married    Spouse name: Not on file   Number of children: 3   Years of education: 29   Highest education level: Professional school degree (e.g., MD, DDS, DVM, JD)  Occupational History   Not on file  Tobacco Use   Smoking status: Never   Smokeless  tobacco: Never  Substance and Sexual Activity   Alcohol  use: Yes    Alcohol /week: 2.0 - 8.0 standard drinks of alcohol     Types: 2 - 8 Standard drinks or equivalent per week    Comment: generally wine, occ beer or hard liquor    Drug use: No   Sexual activity: Not on file  Other Topics Concern   Not on file  Social History Narrative   Lives at home with wife   Right handed   Caffeine: diet coke, 2-4 cups/day   Social Drivers of Corporate investment banker Strain: Not on file  Food Insecurity: Not on file  Transportation Needs: Not on file  Physical Activity: Not on file  Stress: Not on file  Social Connections: Not on file     Family History: The patient's family history includes Parkinson's disease in his maternal aunt, maternal uncle, and mother; Tuberculosis in his maternal grandfather and paternal grandfather. There is no history of Dementia, Alzheimer's disease, or Lung cancer.  ROS:   Please see the history of present illness.    All other systems reviewed and are negative.  EKGs/Labs/Other Studies Reviewed:    The following studies were reviewed today:  EKG:  03/04/24: sinus bradycardia, rate 44, left axis deviation 03/06/2023: Sinus bradycardia, rate 46, left axis deviation 03/04/22: Sinus bradycardia, rate 39, left axis deviation 06/10/21:sinus bradycardia, rate 54, LAD 12/12/2020- The EKG ordered demonstrates sinus bradycardia, rate 51, Q waves in lead 1 and 2  04/19/2020- The EKG ordered demonstrates sinus rhythm, rate 51, left axis deviation, nonspecific T wave flattening  Recent Labs: No results found for requested labs within last 365 days.  Recent Lipid Panel    Component Value Date/Time   CHOL 135 06/10/2021 0944   TRIG 81 06/10/2021 0944   TRIG 264 (HH) 05/27/2006 0848   HDL 50 06/10/2021 0944   CHOLHDL 2.7 06/10/2021 0944   CHOLHDL 5 02/14/2014 0923   VLDL 26.4 02/14/2014 0923   LDLCALC 69 06/10/2021 0944   LDLDIRECT 79.1 04/17/2009 0819     Physical Exam:    VS:  BP 138/78 (BP Location: Left Arm, Patient Position: Sitting, Cuff Size: Normal)   Pulse (!) 44   Ht 5' 10 (1.778 m)   Wt 173 lb 6.4 oz (78.7 kg)   SpO2 94%   BMI 24.88 kg/m     Wt Readings from Last 3 Encounters:  03/04/24 173 lb 6.4 oz (78.7 kg)  09/04/23 174 lb (78.9 kg)  05/14/23 177 lb 12.8 oz (80.6 kg)     GEN:  in no acute distress  HEENT: Normal NECK: No JVD; No carotid bruits CARDIAC: regular, bradycardic, 2/6 systolic murmur RESPIRATORY:  Clear to auscultation without rales, wheezing or rhonchi  ABDOMEN: Soft, non-tender, non-distended MUSCULOSKELETAL:  No edema; No deformity  SKIN: Warm and dry NEUROLOGIC:  Alert and oriented x 3 PSYCHIATRIC:  Normal affect   ASSESSMENT:    1. Sinus bradycardia   2. Coronary artery disease involving native coronary artery of native heart without angina pectoris   3. LVH (left ventricular hypertrophy)   4. Nonrheumatic aortic valve stenosis     PLAN:    Sinus bradycardia: Heart rate as low as high 30s at home at initial clinic visit in 01/2020.   Discontinued atenolol . Zio patch 02/28/2020 after discontinuing atenolol  shows no significant bradyarrhythmia, average heart rate 60 bpm.  Had marked sinus bradycardia to 30s at clinic visit 02/2022.  Referred to EP, saw Dr. Waddell and checked his Zio patch x 3 days.  No significant arrhythmias seen. - Follows with EP, has been asymptomatic, monitoring  CAD: Calcium  score 1450 on 03/08/2021 (87th percentile).  Denies any chest pain.  Does report mild dyspnea.  Lexiscan  Myoview  on 07/25/2021 showed normal perfusion, EF 54%. -Continue aspirin 81 mg daily -Continue rosuvastatin  20 mg daily  LVH: Echocardiogram on 02/13/2020 showed moderate focal basal septal LVH.  Cardiac MRI on 04/04/2020 showed asymmetric hypertrophy measuring up to 16 mm and basal septum (8 mm and posterior wall), meeting criteria for HCM.  However the patient's AS and hypertension could also be  causes of asymmetric hypertrophy.  No LGE on MRI.  Hypertension: On amlodipine  10 mg daily and valsartan  320 mg daily.  Appears controlled.  Aortic stenosis: Echocardiogram on 02/13/2020 showed mild AS, mild AI. Mild AI on CMR 04/04/2020.  Echocardiogram 03/2022 showed mild AI/mild AS.  Echocardiogram 08/2023 showed EF 60 to 65%, normal RV function, mild aortic stenosis/mild aortic regurgitation.  Aortic dilatation: Ascending aorta measured 41 mm on echo on 05/2018.  MRA on 04/04/2020 showed ascending aorta measured 40 mm.  MRA chest on 04/11/2021 showed ascending aortic dilatation measuring 40 mm.  Ascending aorta measured 41 mm on echo 03/2022.  Will repeat echo to monitor prior to next clinic visit  Hyperlipidemia: On rosuvastatin  10 mg daily,  LDL 89 07/16/20.  Calcium  score 1450 02/2021 on 03/08/2021 (87th percentile), rosuvastatin  increased to 20 mg daily.  LDL 59 on 08/19/23  Bronchiectasis: Noted on calcium  score 02/2021, referred to pulmonology.  OSA: Reports compliance with CPAP  RTC in 6 months   Medication Adjustments/Labs and Tests Ordered: Current medicines are reviewed at length with the patient today.  Concerns regarding medicines are outlined above.  Orders Placed This Encounter  Procedures   EKG 12-Lead    No orders of the defined types were placed in this encounter.    Patient Instructions  Medication Instructions:  Continue current medication *If you need a refill on your cardiac medications before your next appointment, please call your pharmacy*  Lab Work: none If you have labs (blood work) drawn today and your tests are completely normal, you will receive your results only by: MyChart Message (if you have MyChart) OR A paper copy in the mail If you have any lab test that is abnormal or we need to change your treatment, we will call you to review the results.  Testing/Procedures: none  Follow-Up: At [facility], you and your health needs are our priority.   As part of our continuing mission to provide you with exceptional heart care, our providers are all part of one team.  This team includes your primary Cardiologist (physician) and Advanced Practice Providers or APPs (Physician Assistants and Nurse Practitioners) who all work together to provide you with the care you need, when you need it.  Your next appointment:  6 month  Provider:   Dr. Kate   We recommend signing up for the patient portal called MyChart.  Sign up information is provided on this After Visit Summary.  MyChart is used to connect with patients for Virtual Visits (Telemedicine).  Patients are able to view lab/test results, encounter notes, upcoming appointments, etc.  Non-urgent messages can be sent to your provider as well.   To learn more about what you can do with MyChart, go to [email or web address].   Other Instructions none          Signed, Lonni LITTIE Kate, MD  03/04/2024 11:20 AM    [facility]

## 2024-03-04 ENCOUNTER — Ambulatory Visit: Attending: Cardiology | Admitting: Cardiology

## 2024-03-04 ENCOUNTER — Encounter: Payer: Self-pay | Admitting: Cardiology

## 2024-03-04 VITALS — BP 138/78 | HR 44 | Ht 70.0 in | Wt 173.4 lb

## 2024-03-04 DIAGNOSIS — I517 Cardiomegaly: Secondary | ICD-10-CM | POA: Diagnosis present

## 2024-03-04 DIAGNOSIS — R001 Bradycardia, unspecified: Secondary | ICD-10-CM | POA: Insufficient documentation

## 2024-03-04 DIAGNOSIS — I350 Nonrheumatic aortic (valve) stenosis: Secondary | ICD-10-CM | POA: Insufficient documentation

## 2024-03-04 DIAGNOSIS — I2510 Atherosclerotic heart disease of native coronary artery without angina pectoris: Secondary | ICD-10-CM | POA: Diagnosis present

## 2024-03-04 NOTE — Patient Instructions (Signed)
 Medication Instructions:  Continue current medication *If you need a refill on your cardiac medications before your next appointment, please call your pharmacy*  Lab Work: none If you have labs (blood work) drawn today and your tests are completely normal, you will receive your results only by: MyChart Message (if you have MyChart) OR A paper copy in the mail If you have any lab test that is abnormal or we need to change your treatment, we will call you to review the results.  Testing/Procedures: none  Follow-Up: At [facility], you and your health needs are our priority.  As part of our continuing mission to provide you with exceptional heart care, our providers are all part of one team.  This team includes your primary Cardiologist (physician) and Advanced Practice Providers or APPs (Physician Assistants and Nurse Practitioners) who all work together to provide you with the care you need, when you need it.  Your next appointment:   6 month  Provider:   Dr. Kate   We recommend signing up for the patient portal called MyChart.  Sign up information is provided on this After Visit Summary.  MyChart is used to connect with patients for Virtual Visits (Telemedicine).  Patients are able to view lab/test results, encounter notes, upcoming appointments, etc.  Non-urgent messages can be sent to your provider as well.   To learn more about what you can do with MyChart, go to [email or web address].   Other Instructions none

## 2024-03-07 ENCOUNTER — Telehealth: Payer: Self-pay | Admitting: Cardiology

## 2024-03-07 NOTE — Telephone Encounter (Signed)
 Paper Work Dropped Off: BP readings  Date: 03-07-24  Location of paper:  Dr. Alvan mailbox

## 2024-03-18 ENCOUNTER — Other Ambulatory Visit: Payer: Self-pay | Admitting: Cardiology

## 2024-03-21 ENCOUNTER — Telehealth: Payer: Self-pay | Admitting: Cardiology

## 2024-03-21 NOTE — Telephone Encounter (Signed)
 Paper Work Dropped Off: Patient dropped off blood pressure report for [employer]  Date:03/21/2024  Location of paper:  In mail box

## 2024-03-28 ENCOUNTER — Telehealth: Payer: Self-pay

## 2024-03-28 NOTE — Telephone Encounter (Signed)
 Paper Work Dropped Off: 03-28-24  [facility]  5th floor  Date: 03-28-24  Location of paper:  Dr Alvan MailBox on 5th floor

## 2024-04-04 ENCOUNTER — Telehealth: Payer: Self-pay | Admitting: Cardiology

## 2024-04-04 NOTE — Telephone Encounter (Signed)
 Paper Work Dropped Off: BP readings  Date: 04-04-24  Location of paper:  Dr. Alvan raenette MALTESE, 04-04-24

## 2024-04-11 ENCOUNTER — Telehealth: Payer: Self-pay | Admitting: Cardiology

## 2024-04-11 NOTE — Telephone Encounter (Signed)
  Paper Work Dropped Off:  blood pressure readings  Date: 04-11-24  Location of paper:  In Dr [employer] mailbox

## 2024-04-11 NOTE — Telephone Encounter (Signed)
 Called and made spouse. Susy aware that blood pressure will be given to provider. Pt and spouse verbalized an understanding.

## 2024-04-18 ENCOUNTER — Telehealth: Payer: Self-pay | Admitting: Cardiology

## 2024-04-18 NOTE — Telephone Encounter (Signed)
 Patient dropped off BP log for Dr. Alvan review - I will leave in the Dr's mailbox later this afternoon.  Thank you.

## 2024-04-25 ENCOUNTER — Telehealth: Payer: Self-pay | Admitting: Cardiology

## 2024-04-25 NOTE — Telephone Encounter (Signed)
 Paper Work Dropped Off: Patient dropped off his blood pressure report  Date:04-25-2024  Location of paper:  [employer], [employer]

## 2024-05-11 ENCOUNTER — Telehealth: Payer: Self-pay | Admitting: Cardiology

## 2024-05-11 NOTE — Telephone Encounter (Signed)
 Paper Work Dropped Off: Patient dropped off blood pressure report   Date:05/11/2024  Location of paper:  [employer] technology officer

## 2024-06-12 ENCOUNTER — Other Ambulatory Visit: Payer: Self-pay | Admitting: Cardiology

## 2024-07-12 ENCOUNTER — Encounter: Payer: Self-pay | Admitting: Podiatry

## 2024-07-12 ENCOUNTER — Ambulatory Visit: Admitting: Podiatry

## 2024-07-12 DIAGNOSIS — L309 Dermatitis, unspecified: Secondary | ICD-10-CM | POA: Diagnosis not present

## 2024-07-12 MED ORDER — CLOTRIMAZOLE-BETAMETHASONE 1-0.05 % EX CREA: 1.0000 | TOPICAL_CREAM | Freq: Every day | CUTANEOUS | 1 refills | Status: AC

## 2024-07-13 NOTE — Progress Notes (Signed)
"  °  Subjective:  Patient ID: Albert Pierce, male    DOB: [birth year],  MRN: [id number]  Chief Complaint  Patient presents with   Nail Problem    Patient is here for tinea pedis, dry cracked toes, discoloration and, Pain due to onychomycosis of toenails of both feet      78 y.o. male presents with the above complaint. History confirmed with patient.  PCP was concerned for fungal infection and was referred for this.  They have been focusing as well as applying Vaseline  Objective:  Physical Exam: warm, good capillary refill, no trophic changes or ulcerative lesions, normal DP and PT pulses, normal sensory exam, and dry cracked and peeling skin at tips of toes improving  Assessment:   1. Dermatitis of foot      Plan:  Patient was evaluated and treated and all questions answered.  His skin.  More of an inflammatory condition I recommended treatment with Lotrisone cream for anti-inflammatory and antifungal effect.  Follow-up as needed that should improve the next few weeks  No follow-ups on file.   "

## 2024-08-15 ENCOUNTER — Ambulatory Visit (HOSPITAL_COMMUNITY)
Admission: RE | Admit: 2024-08-15 | Discharge: 2024-08-15 | Disposition: A | Discharge status: Home / Self Care | Source: Ambulatory Visit | Attending: Cardiology | Admitting: Cardiology

## 2024-08-15 DIAGNOSIS — I350 Nonrheumatic aortic (valve) stenosis: Secondary | ICD-10-CM | POA: Diagnosis not present

## 2024-08-15 DIAGNOSIS — I77819 Aortic ectasia, unspecified site: Secondary | ICD-10-CM

## 2024-08-15 LAB — ECHOCARDIOGRAM COMPLETE
AR max vel: 1.17 cm2
AV Area VTI: 1.19 cm2
AV Area mean vel: 1.3 cm2
AV Mean grad: 24 mmHg
AV Peak grad: 43.3 mmHg
Ao pk vel: 3.29 m/s
Area-P 1/2: 2.47 cm2
P 1/2 time: 1007 ms
S' Lateral: 2.6 cm

## 2024-08-16 ENCOUNTER — Ambulatory Visit: Payer: Self-pay | Admitting: Cardiology

## 2024-08-16 DIAGNOSIS — I350 Nonrheumatic aortic (valve) stenosis: Secondary | ICD-10-CM

## 2024-09-15 ENCOUNTER — Ambulatory Visit: Admitting: Cardiology
